# Patient Record
Sex: Male | Born: 1935 | Race: White | Hispanic: No | Marital: Married | Smoking: Never smoker
Health system: Southern US, Community
[De-identification: ages and names within clinical notes are randomized; demographics above are authoritative.]

## PROBLEM LIST (undated history)

## (undated) DIAGNOSIS — E039 Hypothyroidism, unspecified: Secondary | ICD-10-CM

## (undated) DIAGNOSIS — K5790 Diverticulosis of intestine, part unspecified, without perforation or abscess without bleeding: Secondary | ICD-10-CM

## (undated) DIAGNOSIS — I10 Essential (primary) hypertension: Secondary | ICD-10-CM

## (undated) DIAGNOSIS — B019 Varicella without complication: Secondary | ICD-10-CM

## (undated) DIAGNOSIS — K449 Diaphragmatic hernia without obstruction or gangrene: Secondary | ICD-10-CM

## (undated) DIAGNOSIS — K649 Unspecified hemorrhoids: Secondary | ICD-10-CM

## (undated) DIAGNOSIS — E0590 Thyrotoxicosis, unspecified without thyrotoxic crisis or storm: Secondary | ICD-10-CM

## (undated) DIAGNOSIS — E049 Nontoxic goiter, unspecified: Secondary | ICD-10-CM

## (undated) DIAGNOSIS — D126 Benign neoplasm of colon, unspecified: Secondary | ICD-10-CM

## (undated) DIAGNOSIS — N529 Male erectile dysfunction, unspecified: Secondary | ICD-10-CM

## (undated) DIAGNOSIS — I440 Atrioventricular block, first degree: Secondary | ICD-10-CM

## (undated) DIAGNOSIS — I4892 Unspecified atrial flutter: Secondary | ICD-10-CM

## (undated) DIAGNOSIS — G4730 Sleep apnea, unspecified: Secondary | ICD-10-CM

## (undated) DIAGNOSIS — K8020 Calculus of gallbladder without cholecystitis without obstruction: Secondary | ICD-10-CM

## (undated) DIAGNOSIS — K219 Gastro-esophageal reflux disease without esophagitis: Secondary | ICD-10-CM

## (undated) DIAGNOSIS — R251 Tremor, unspecified: Secondary | ICD-10-CM

## (undated) DIAGNOSIS — Z7901 Long term (current) use of anticoagulants: Secondary | ICD-10-CM

## (undated) DIAGNOSIS — M1990 Unspecified osteoarthritis, unspecified site: Secondary | ICD-10-CM

## (undated) DIAGNOSIS — K635 Polyp of colon: Secondary | ICD-10-CM

## (undated) DIAGNOSIS — T7840XA Allergy, unspecified, initial encounter: Secondary | ICD-10-CM

## (undated) DIAGNOSIS — N400 Enlarged prostate without lower urinary tract symptoms: Secondary | ICD-10-CM

## (undated) HISTORY — DX: Varicella without complication: B01.9

## (undated) HISTORY — DX: Essential (primary) hypertension: I10

## (undated) HISTORY — DX: Hypothyroidism, unspecified: E03.9

## (undated) HISTORY — PX: NASAL SEPTUM SURGERY: SHX37

## (undated) HISTORY — DX: Gilbert syndrome: E80.4

## (undated) HISTORY — DX: Calculus of gallbladder without cholecystitis without obstruction: K80.20

## (undated) HISTORY — PX: OTHER SURGICAL HISTORY: SHX169

## (undated) HISTORY — DX: Unspecified osteoarthritis, unspecified site: M19.90

## (undated) HISTORY — DX: Diaphragmatic hernia without obstruction or gangrene: K44.9

## (undated) HISTORY — PX: APPENDECTOMY: SHX54

## (undated) HISTORY — DX: Sleep apnea, unspecified: G47.30

## (undated) HISTORY — DX: Long term (current) use of anticoagulants: Z79.01

## (undated) HISTORY — DX: Unspecified atrial flutter: I48.92

## (undated) HISTORY — DX: Benign neoplasm of colon, unspecified: D12.6

## (undated) HISTORY — DX: Nontoxic goiter, unspecified: E04.9

## (undated) HISTORY — DX: Unspecified hemorrhoids: K64.9

## (undated) HISTORY — PX: TONSILLECTOMY: SHX5217

## (undated) HISTORY — DX: Thyrotoxicosis, unspecified without thyrotoxic crisis or storm: E05.90

## (undated) HISTORY — PX: POLYPECTOMY: SHX149

## (undated) HISTORY — DX: Gastro-esophageal reflux disease without esophagitis: K21.9

## (undated) HISTORY — DX: Allergy, unspecified, initial encounter: T78.40XA

## (undated) HISTORY — DX: Tremor, unspecified: R25.1

## (undated) HISTORY — DX: Polyp of colon: K63.5

## (undated) HISTORY — PX: COLONOSCOPY: SHX174

## (undated) HISTORY — DX: Atrioventricular block, first degree: I44.0

## (undated) HISTORY — DX: Diverticulosis of intestine, part unspecified, without perforation or abscess without bleeding: K57.90

## (undated) HISTORY — DX: Male erectile dysfunction, unspecified: N52.9

## (undated) HISTORY — DX: Benign prostatic hyperplasia without lower urinary tract symptoms: N40.0

## (undated) HISTORY — PX: PALATE / UVULA BIOPSY / EXCISION: SUR128

---

## 1999-11-05 ENCOUNTER — Encounter: Payer: Self-pay | Admitting: Dentistry

## 1999-11-05 ENCOUNTER — Ambulatory Visit (HOSPITAL_COMMUNITY): Admission: RE | Admit: 1999-11-05 | Discharge: 1999-11-05 | Payer: Self-pay | Admitting: Dentistry

## 2000-05-24 ENCOUNTER — Encounter (INDEPENDENT_AMBULATORY_CARE_PROVIDER_SITE_OTHER): Payer: Self-pay | Admitting: Specialist

## 2000-05-24 ENCOUNTER — Other Ambulatory Visit: Admission: RE | Admit: 2000-05-24 | Discharge: 2000-05-24 | Payer: Self-pay | Admitting: Urology

## 2001-06-14 ENCOUNTER — Ambulatory Visit (HOSPITAL_COMMUNITY): Admission: RE | Admit: 2001-06-14 | Discharge: 2001-06-14 | Payer: Self-pay | Admitting: Neurology

## 2001-06-14 ENCOUNTER — Encounter: Payer: Self-pay | Admitting: Neurology

## 2003-03-11 ENCOUNTER — Encounter: Payer: Self-pay | Admitting: Internal Medicine

## 2003-03-11 ENCOUNTER — Ambulatory Visit (HOSPITAL_COMMUNITY): Admission: RE | Admit: 2003-03-11 | Discharge: 2003-03-11 | Payer: Self-pay | Admitting: Internal Medicine

## 2005-04-12 ENCOUNTER — Encounter: Admission: RE | Admit: 2005-04-12 | Discharge: 2005-04-12 | Payer: Self-pay | Admitting: Internal Medicine

## 2005-05-06 ENCOUNTER — Encounter: Admission: RE | Admit: 2005-05-06 | Discharge: 2005-05-06 | Payer: Self-pay | Admitting: Endocrinology

## 2005-07-18 ENCOUNTER — Encounter (HOSPITAL_COMMUNITY): Admission: RE | Admit: 2005-07-18 | Discharge: 2005-07-19 | Payer: Self-pay | Admitting: Endocrinology

## 2006-12-05 ENCOUNTER — Ambulatory Visit: Payer: Self-pay | Admitting: Internal Medicine

## 2006-12-20 ENCOUNTER — Ambulatory Visit: Payer: Self-pay | Admitting: Internal Medicine

## 2006-12-20 ENCOUNTER — Encounter: Payer: Self-pay | Admitting: Internal Medicine

## 2007-03-15 ENCOUNTER — Ambulatory Visit (HOSPITAL_COMMUNITY): Admission: RE | Admit: 2007-03-15 | Discharge: 2007-03-15 | Payer: Self-pay | Admitting: *Deleted

## 2007-03-15 HISTORY — PX: CARDIOVERSION: SHX1299

## 2008-04-03 ENCOUNTER — Encounter: Admission: RE | Admit: 2008-04-03 | Discharge: 2008-04-03 | Payer: Self-pay | Admitting: Internal Medicine

## 2009-06-18 ENCOUNTER — Encounter: Admission: RE | Admit: 2009-06-18 | Discharge: 2009-06-18 | Payer: Self-pay | Admitting: Internal Medicine

## 2009-06-18 ENCOUNTER — Inpatient Hospital Stay (HOSPITAL_COMMUNITY): Admission: AD | Admit: 2009-06-18 | Discharge: 2009-06-21 | Payer: Self-pay | Admitting: Orthopedic Surgery

## 2010-08-22 LAB — COMPREHENSIVE METABOLIC PANEL
AST: 24 U/L (ref 0–37)
Albumin: 3.7 g/dL (ref 3.5–5.2)
CO2: 28 mEq/L (ref 19–32)
Calcium: 8.6 mg/dL (ref 8.4–10.5)
Creatinine, Ser: 0.76 mg/dL (ref 0.4–1.5)
Glucose, Bld: 107 mg/dL — ABNORMAL HIGH (ref 70–99)
Sodium: 137 mEq/L (ref 135–145)
Total Bilirubin: 1.9 mg/dL — ABNORMAL HIGH (ref 0.3–1.2)
Total Protein: 6.1 g/dL (ref 6.0–8.3)

## 2010-08-22 LAB — PROTIME-INR
INR: 2.91 — ABNORMAL HIGH (ref 0.00–1.49)
INR: 2.99 — ABNORMAL HIGH (ref 0.00–1.49)
INR: 3.07 — ABNORMAL HIGH (ref 0.00–1.49)
INR: 3.12 — ABNORMAL HIGH (ref 0.00–1.49)
Prothrombin Time: 30.8 seconds — ABNORMAL HIGH (ref 11.6–15.2)
Prothrombin Time: 31.5 seconds — ABNORMAL HIGH (ref 11.6–15.2)
Prothrombin Time: 31.9 seconds — ABNORMAL HIGH (ref 11.6–15.2)

## 2010-08-22 LAB — URINALYSIS, MICROSCOPIC ONLY
Bilirubin Urine: NEGATIVE
Glucose, UA: NEGATIVE mg/dL
Nitrite: NEGATIVE
Protein, ur: NEGATIVE mg/dL
pH: 6.5 (ref 5.0–8.0)

## 2010-08-22 LAB — CBC
Hemoglobin: 14.7 g/dL (ref 13.0–17.0)
MCHC: 34.4 g/dL (ref 30.0–36.0)
MCV: 93.6 fL (ref 78.0–100.0)
RDW: 12.8 % (ref 11.5–15.5)

## 2010-08-22 LAB — DIFFERENTIAL
Basophils Absolute: 0 10*3/uL (ref 0.0–0.1)
Lymphocytes Relative: 10 % — ABNORMAL LOW (ref 12–46)
Lymphs Abs: 1 10*3/uL (ref 0.7–4.0)
Monocytes Absolute: 0.6 10*3/uL (ref 0.1–1.0)

## 2010-10-19 NOTE — H&P (Signed)
NAME:  Kevin Potter, Kevin Potter NO.:  [id number]   MEDICAL RECORD NO.:  [id number]           PATIENT TYPE:   LOCATION:                                 FACILITY:   PHYSICIAN:  Elmore Guise., M.D.DATE OF BIRTH:  Apr 26, 1932   DATE OF ADMISSION:  03/15/2007  DATE OF DISCHARGE:                              HISTORY & PHYSICAL   INDICATION:  Atrial flutter, patient for elective cardioversion.   HISTORY OF PRESENT ILLNESS:  Kevin Potter is a very pleasant 77 year old  white male with past medical history of hypertension, benign tremor, who  was recently diagnosed with atrial flutter.  He was seen as a new  patient evaluation back in September. At that time he denied any  exertional chest pain or shortness of breath.  No orthopnea, no PND.  He  was started on Coumadin because his heart was out of rhythm.  Since  his last visit, he reports that, I just don't feel right.  He would  like to go ahead and pursue elective cardioversion.  He he has had his  Coumadin level checked, and he has been therapeutic his last three  checks.  He does report occasional episodes of dyspnea as well as  decreased exertional tolerance, and he would really like to come off  Coumadin if possible.  He has had no recent fever or cough.  No  orthopnea or PND.   REVIEW OF SYSTEMS:  Otherwise negative.   CURRENT MEDICATIONS:  1. Propranolol 20 mg three times daily.  2. Trihexyphenidyl  2 mg three times daily.  3. Coumadin 2.5 mg 7 days a week.  4. Lorazepam 1 mg daily.  5. Fluticasone spray once daily.  6. Levitra or Viagra p.r.n.   ALLERGIES:  None.   FAMILY HISTORY:  Positive for heart disease in his mother and brothers.   SOCIAL HISTORY:  He is married.  He works as a Forensic scientist.  He  tries to walk 30 minutes per day.  No tobacco.  He drinks one glass of  red wine daily.  No significant caffeine intake.   PAST SURGICAL HISTORY:  1. Appendectomy.  2. Deviated septum repair.  3.  Tonsillectomy.  4. He has also had his uvula removed secondary to sleep apnea.   PHYSICAL EXAMINATION:  VITAL SIGNS:  His weight is 209 pounds.  His  blood pressure is 90/70.  His heart rate is 76 and regular.  GENERAL:  He is a very pleasant elderly white male, alert and oriented  x4, in no acute distress.  NECK:  He has no JVD.  LUNGS:  Clear.  HEART:  Regular with normal S1-S2.  ABDOMEN:  Soft, nontender, nondistended.  No rebound or guarding.  EXTREMITIES:  Warm with 2+ pulses and no significant edema.   His last stress evaluation was February 09, 2007, and showed no  inducible ischemia with mild LV dysfunction with an EF of 47%.   His last Coumadin level per his recall is 2.0 with one prior to that 2.4  and then 2.5.  He had a BUN  and creatinine of 11 and 0.8, respectively,  potassium of 4.2.  White blood cell count of 5.0 with a hemoglobin of  14.8 and platelet count of 221.   IMPRESSION:  1. Atrial flutter.  2. History of very mild left ventricular dysfunction with ejection      fraction of 45-50%.  3. Hypertension.   PLAN:  1. I discussed risks and benefits of elective cardioversion with him      at length.  He would like to proceed with the procedure.  This will      be scheduled for October 9.  Should he remain in sinus rhythm for 4      weeks, I did discuss at that time we can discontinue his Coumadin.  2. Further recommendations pending his cardioversion.      Elmore Guise., M.D.  Electronically Signed     TWK/MEDQ  D:  03/12/2007  T:  03/12/2007  Job:  [id number]   cc:   Loraine Leriche A. Perini, M.D.

## 2010-10-19 NOTE — Op Note (Signed)
NAME:  Kevin Potter, Kevin Potter NO.:  [id number]   MEDICAL RECORD NO.:  [id number]          PATIENT TYPE:  OIB   LOCATION:  2853                         FACILITY:  [facility]   PHYSICIAN:  Elmore Guise., M.D.DATE OF BIRTH:  1931-09-01   DATE OF PROCEDURE:  03/15/2007  DATE OF DISCHARGE:                               OPERATIVE REPORT   PROCEDURE:  Elective DC cardioversion.   HISTORY OF PRESENT ILLNESS:  [name] is a very pleasant, 77 year old,  white male past medical history of atrial flutter, hypertension, benign  tremor who presents for elective cardioversion.  The patient was  diagnosed with atrial flutter approximately 6 weeks ago.  He was placed  on Coumadin and he has been therapeutic with his INR for the last 4  weeks.  He is now referred for elective cardioversion.   DESCRIPTION OF PROCEDURE:  The patient underwent successful  cardioversion, anesthesia was present for sedation.  The patient had  cardioversion x1 with 50 joules pads in the anterior and posterior  position.  No apparent complications.  The patient tolerated the  procedure well.      Elmore Guise., M.D.  Electronically Signed     TWK/MEDQ  D:  03/15/2007  T:  03/15/2007  Job:  [id number]

## 2010-10-25 ENCOUNTER — Other Ambulatory Visit: Payer: Self-pay | Admitting: Cardiology

## 2010-10-26 ENCOUNTER — Telehealth: Payer: Self-pay | Admitting: Cardiology

## 2010-10-26 NOTE — Telephone Encounter (Signed)
RETURNING CALL. NOT SURE WHO CALLED OR WHY.

## 2010-10-26 NOTE — Telephone Encounter (Signed)
escribe medication per fax request ; LM for pt to call to schedule an app.

## 2010-10-26 NOTE — Telephone Encounter (Signed)
Had LM for him to call back to schedule an app since it has been a yr. Refilled his med. Gave him an app for June.

## 2010-11-06 ENCOUNTER — Other Ambulatory Visit: Payer: Self-pay | Admitting: Cardiology

## 2010-11-08 NOTE — Telephone Encounter (Signed)
escribe medication per fax request  

## 2010-11-17 ENCOUNTER — Encounter: Payer: Self-pay | Admitting: Cardiology

## 2010-11-22 ENCOUNTER — Ambulatory Visit (INDEPENDENT_AMBULATORY_CARE_PROVIDER_SITE_OTHER): Payer: Medicare Other | Admitting: Cardiology

## 2010-11-22 ENCOUNTER — Encounter: Payer: Self-pay | Admitting: Cardiology

## 2010-11-22 VITALS — BP 104/70 | HR 83 | Ht 74.0 in | Wt 209.0 lb

## 2010-11-22 DIAGNOSIS — I10 Essential (primary) hypertension: Secondary | ICD-10-CM

## 2010-11-22 DIAGNOSIS — Z7901 Long term (current) use of anticoagulants: Secondary | ICD-10-CM

## 2010-11-22 DIAGNOSIS — I4892 Unspecified atrial flutter: Secondary | ICD-10-CM

## 2010-11-22 NOTE — Patient Instructions (Signed)
Continue your same therapy.  I will see you again in 1 year.

## 2010-11-23 ENCOUNTER — Encounter: Payer: Self-pay | Admitting: Cardiology

## 2010-11-23 DIAGNOSIS — I10 Essential (primary) hypertension: Secondary | ICD-10-CM | POA: Insufficient documentation

## 2010-11-23 DIAGNOSIS — Z7901 Long term (current) use of anticoagulants: Secondary | ICD-10-CM | POA: Insufficient documentation

## 2010-11-23 DIAGNOSIS — I4892 Unspecified atrial flutter: Secondary | ICD-10-CM | POA: Insufficient documentation

## 2010-11-23 NOTE — Assessment & Plan Note (Signed)
Well controlled on propranolol. 

## 2010-11-23 NOTE — Assessment & Plan Note (Signed)
He has chronic atrial flutter. His rate was well controlled on propranolol. He is anticoagulated. Since he is asymptomatic I see no reason to pursue further therapies. If he were symptomatic we could consider ablation. He will need to continue anticoagulation long term.

## 2010-11-23 NOTE — Progress Notes (Signed)
   Hilbert Bible Date of Birth: April 19, 1932   History of Present Illness: Mr. Kevin Potter is seen for yearly followup. He has a history of chronic atrial flutter. He had cardioversion in 2008 but had recurrence. He was intolerant to Rythmol. He was subsequently treated with rate control and anticoagulation. He denies any cardiac symptoms. He denies dizziness, shortness of breath, chest pain, palpitations. He has had no edema. He walks every day.  Current Outpatient Prescriptions on File Prior to Visit  Medication Sig Dispense Refill  . finasteride (PROSCAR) 5 MG tablet Take 5 mg by mouth daily.        . fluticasone (VERAMYST) 27.5 MCG/SPRAY nasal spray Place 2 sprays into the nose as needed.       . loperamide (IMODIUM) 2 MG capsule Take 2 mg by mouth 2 (two) times daily.        Marland Kitchen LORazepam (ATIVAN) 1 MG tablet Take 1 mg by mouth daily.        Marland Kitchen PRADAXA 150 MG CAPS TAKE ONE CAPSULE BY MOUTH TWICE DAILY  60 capsule  5  . propranolol (INDERAL) 20 MG tablet Take 20 mg by mouth 3 (three) times daily.        . Tamsulosin HCl (FLOMAX) 0.4 MG CAPS Take 0.4 mg by mouth daily.        . trihexyphenidyl (ARTANE) 2 MG tablet Take 2 mg by mouth 3 (three) times daily with meals.          No Known Allergies  Past Medical History  Diagnosis Date  . Atrial flutter   . Chronic anticoagulation   . Tremor   . Hypertension     Past Surgical History  Procedure Date  . Cardioversion 03/15/2007    elective DC/x1 with joint pads in the anterior & posterior position/no complications  . Appendectomy   . Nasal septum surgery   . Tonsillectomy     with uvula removed  . Left ankle surgery     History  Smoking status  . Never Smoker   Smokeless tobacco  . Never Used    History  Alcohol Use No    Family History  Problem Relation Age of Onset  . Cancer Mother   . Cancer Sister     Review of Systems: The review of systems is positive for April muscle in his left flank. The symptoms have resolved.   All other systems were reviewed and are negative.  Physical Exam: BP 104/70  Pulse 83  Ht 6\' 2"  (1.88 m)  Wt 209 lb (94.802 kg)  BMI 26.83 kg/m2 He is a pleasant elderly white male in no acute distress. HEENT exam is unremarkable. He has no JVD or bruits. Lungs are clear. Cardiac exam reveals an irregular rate and rhythm without gallop, murmur, or click. Abdomen is soft and nontender. He has no edema. Pedal pulses are excellent. His neurologic exam is nonfocal. LABORATORY DATA: ECG today demonstrates atrial flutter with variable block. Rate is 83 beats per minute. He has a nonspecific interventricular conduction delay. He has occasional PVCs.  Assessment / Plan:

## 2011-03-17 LAB — BASIC METABOLIC PANEL
CO2: 27
Chloride: 109
Creatinine, Ser: 0.79
GFR calc Af Amer: >60
GFR calc non Af Amer: >60
Glucose, Bld: 95

## 2011-03-17 LAB — CBC: Hemoglobin: 14.9

## 2011-05-02 ENCOUNTER — Encounter: Payer: Self-pay | Admitting: Cardiology

## 2011-05-02 ENCOUNTER — Telehealth: Payer: Self-pay | Admitting: Cardiology

## 2011-05-02 NOTE — Telephone Encounter (Signed)
As per Chasity, Mr Kevin Potter is scheduled to have cold thermo therapy procedure in the office on Dec 3rd with Dr Annabell Howells at [facility] urology. The pt has been cleared to come off Pradaxa and Aspirin per Dr Waynard Edwards but Forde Dandy had also sent the request to [facility]. Dr Swaziland also aware pt is for procedure and cleared pt. Pt will take last dose of Pradaxa and Aspirin on Nov 30th and resume on Dec 4th as advised per Dr Annabell Howells.  Letter faxed to [facility].

## 2011-05-02 NOTE — Telephone Encounter (Signed)
New problem:  Per Chasity. Surgery is 12 /3 @ 1 p.m. Pt need to come office [facility] . Fax over request on 11/21.

## 2011-05-04 ENCOUNTER — Other Ambulatory Visit: Payer: Self-pay | Admitting: Cardiology

## 2011-05-09 ENCOUNTER — Telehealth: Payer: Self-pay | Admitting: Cardiology

## 2011-05-09 NOTE — Telephone Encounter (Signed)
Called stating he had the prostate procedure done today and wanted to know when he should restart Pradaxa. Per Dr. Swaziland if he did not have any bleeding then restart Pradaxa tomorrow. Kevin Potter says he did not have any bleeding so advised to start back on Pradaxa tomorrow.

## 2011-05-09 NOTE — Telephone Encounter (Signed)
New message:  Pt went off of Pradaxa  for procedure and had this procedure today.  Pt would like to know when he should start taking this again.  Please call and advise.

## 2011-08-17 ENCOUNTER — Ambulatory Visit (INDEPENDENT_AMBULATORY_CARE_PROVIDER_SITE_OTHER): Payer: Medicare Other | Admitting: Internal Medicine

## 2011-08-17 ENCOUNTER — Encounter: Payer: Self-pay | Admitting: Internal Medicine

## 2011-08-17 ENCOUNTER — Telehealth: Payer: Self-pay

## 2011-08-17 VITALS — BP 112/80 | HR 60 | Ht 74.0 in | Wt 211.6 lb

## 2011-08-17 DIAGNOSIS — Z86010 Personal history of colonic polyps: Secondary | ICD-10-CM

## 2011-08-17 DIAGNOSIS — K5730 Diverticulosis of large intestine without perforation or abscess without bleeding: Secondary | ICD-10-CM

## 2011-08-17 DIAGNOSIS — R195 Other fecal abnormalities: Secondary | ICD-10-CM

## 2011-08-17 MED ORDER — PEG-KCL-NACL-NASULF-NA ASC-C 100 G PO SOLR: 1.0000 | Freq: Once | ORAL | Status: DC

## 2011-08-17 NOTE — Telephone Encounter (Signed)
08/17/2011    RE: Kevin Potter DOB: 10/31/1931 MRN: [id number]   Dear Dr. Swaziland,    We have scheduled the above patient for an endoscopic procedure. Our records show that he is on anticoagulation therapy.   Please advise as to how long the patient may come off his therapy of Pradaxa prior to the procedure, which is scheduled for 10-04-11.  Please fax back/ or route the completed form to [facility] at [phone].   Sincerely,  Gracy Racer

## 2011-08-17 NOTE — Patient Instructions (Addendum)
You have been scheduled for an endoscopy and colonoscopy with propofol. Please follow the written instructions given to you at your visit today. Please pick up your prep at the pharmacy within the next 1-3 days.  We will contact you regarding your Pradaxa after we have heard from your cardiologist

## 2011-08-17 NOTE — Progress Notes (Signed)
HISTORY OF PRESENT ILLNESS:  Kevin Potter is a 78 y.o. male with MULTIPLE significant medical problems as listed below. These include, but are not limited to, hypertension, thyroid disease, sleep apnea, and atrial arrhythmia for which she is on chronic anticoagulation in the form of Pradaxa. He has been followed in this office for history of adenomatous colon polyps. Index colonoscopy was performed 2005. Followup colonoscopy performed in 2008. Also known to have marked diverticulosis. He is sent today regarding Hemoccult-positive stool, identified on routine annual physical examination. Patient denies any GI symptoms. Specifically, he denies melena or hematochezia. Review of outside records findings positive hemosure test 07/15/2011. Review of laboratories dated 07/12/2011 reveals no significant abnormalities. His hemoglobin was normal at 15.4 with normal MCV. Elevated bilirubin of 1.7 consistent with Gilbert's. The patient has not had upper endoscopy. He is not on PPI prophylactically. He denies NSAID use  REVIEW OF SYSTEMS:  All non-GI ROS negative except for tremor, irregular heart rate, joint aches  Past Medical History  Diagnosis Date  . Atrial flutter   . Chronic anticoagulation   . Tremor   . Hypertension   . Sleep apnea   . Hypothyroidism   . Goiter   . Erectile dysfunction   . Hyperthyroidism   . BPH (benign prostatic hyperplasia)   . 1st degree AV block   . Gallstones   . Colonic adenoma   . Chicken pox   . Gilbert's syndrome     Past Surgical History  Procedure Date  . Cardioversion 03/15/2007    elective DC/x1 with joint pads in the anterior & posterior position/no complications  . Appendectomy   . Nasal septum surgery   . Tonsillectomy     with uvula removed  . Left ankle surgery     Social History Kevin Potter  reports that he has never smoked. He has never used smokeless tobacco. He reports that he drinks alcohol. His drug history not on file.  family history  includes Cancer in his mother and sister and Heart disease in his brother, father, and mother.  No Known Allergies     PHYSICAL EXAMINATION: Vital signs: BP 112/80  Pulse 60  Ht 6\' 2"  (1.88 m)  Wt 211 lb 9.6 oz (95.981 kg)  BMI 27.17 kg/m2  Constitutional: generally well-appearing, no acute distress Psychiatric: alert and oriented x3, cooperative Eyes: extraocular movements intact, anicteric, conjunctiva pink Mouth: oral pharynx moist, no lesions Neck: supple no lymphadenopathy Cardiovascular: heart regular rate and rhythm, no murmur Lungs: clear to auscultation bilaterally Abdomen: soft, nontender, nondistended, no obvious ascites, no peritoneal signs, normal bowel sounds, no organomegaly Rectal:deferred until colonoscopy Extremities: no lower extremity edema bilaterally Skin: no lesions on visible extremities Neuro: No focal deficits. No asterixis. Resting tremor of the head    ASSESSMENT:  #1. Hemoccult-positive stool in the absence of GI symptoms with a normal hemoglobin in a patient on Pradaxa #2. History of adenomatous colon polyps. Last colonoscopy 2008. Due for followup this year #3. Marked diverticulosis #4. Multiple significant medical problems as outlined above  PLAN:  #1. Colonoscopy and upper endoscopy to evaluate Hemoccult-positive stool and provide colorectal neoplasia surveillance. The patient is high-risk given his comorbidities and the need to address Pradaxa therapy.The nature of the procedure, as well as the risks, benefits, and alternatives were carefully and thoroughly reviewed with the patient. Ample time for discussion and questions allowed. The patient understood, was satisfied, and agreed to proceed. Movi prep prescribed. Patient was instructed on its use. #2.  Consult with the patient's cardiologist, Dr. Swaziland, regarding the feasibility of holding Pradaxa therapy prior to his procedures, with the plans to resume therapy post procedure. Patient tells me  that he has held anticoagulation therapy in the past for his prostate related procedures.

## 2011-08-18 NOTE — Telephone Encounter (Signed)
He may stop Pradaxa 48 hours before procedure. Ysidro Ramsay Swaziland MD, [facility]

## 2011-08-19 ENCOUNTER — Telehealth: Payer: Self-pay

## 2011-08-19 NOTE — Telephone Encounter (Signed)
Left message for patient to call me back. 

## 2011-08-24 ENCOUNTER — Telehealth: Payer: Self-pay

## 2011-08-24 NOTE — Telephone Encounter (Signed)
Told patient Dr. Swaziland had said he could be off his Pradaxa for 48 hours prior to procedure; patient acknowledged

## 2011-10-04 ENCOUNTER — Ambulatory Visit (AMBULATORY_SURGERY_CENTER): Payer: Medicare Other | Admitting: Internal Medicine

## 2011-10-04 ENCOUNTER — Encounter: Payer: Self-pay | Admitting: Internal Medicine

## 2011-10-04 VITALS — BP 135/98 | HR 83 | Temp 97.6°F | Resp 16 | Ht 74.0 in | Wt 211.0 lb

## 2011-10-04 DIAGNOSIS — D126 Benign neoplasm of colon, unspecified: Secondary | ICD-10-CM

## 2011-10-04 DIAGNOSIS — Z1211 Encounter for screening for malignant neoplasm of colon: Secondary | ICD-10-CM

## 2011-10-04 DIAGNOSIS — R195 Other fecal abnormalities: Secondary | ICD-10-CM

## 2011-10-04 DIAGNOSIS — Z86010 Personal history of colonic polyps: Secondary | ICD-10-CM

## 2011-10-04 DIAGNOSIS — K222 Esophageal obstruction: Secondary | ICD-10-CM

## 2011-10-04 DIAGNOSIS — K5730 Diverticulosis of large intestine without perforation or abscess without bleeding: Secondary | ICD-10-CM

## 2011-10-04 MED ORDER — SODIUM CHLORIDE 0.9 % IV SOLN: 500.0000 mL | INTRAVENOUS | Status: DC

## 2011-10-04 NOTE — Patient Instructions (Signed)
YOU HAD AN ENDOSCOPIC PROCEDURE TODAY AT THE [facility]: Refer to the procedure report that was given to you for any specific questions about what was found during the examination.  If the procedure report does not answer your questions, please call your gastroenterologist to clarify.  If you requested that your care partner not be given the details of your procedure findings, then the procedure report has been included in a sealed envelope for you to review at your convenience later.  YOU SHOULD EXPECT: Some feelings of bloating in the abdomen. Passage of more gas than usual.  Walking can help get rid of the air that was put into your GI tract during the procedure and reduce the bloating. If you had a lower endoscopy (such as a colonoscopy or flexible sigmoidoscopy) you may notice spotting of blood in your stool or on the toilet paper. If you underwent a bowel prep for your procedure, then you may not have a normal bowel movement for a few days.  DIET: Your first meal following the procedure should be a light meal and then it is ok to progress to your normal diet.  A half-sandwich or bowl of soup is an example of a good first meal.  Heavy or fried foods are harder to digest and may make you feel nauseous or bloated.  Likewise meals heavy in dairy and vegetables can cause extra gas to form and this can also increase the bloating.  Drink plenty of fluids but you should avoid alcoholic beverages for 24 hours.  ACTIVITY: Your care partner should take you home directly after the procedure.  You should plan to take it easy, moving slowly for the rest of the day.  You can resume normal activity the day after the procedure however you should NOT DRIVE or use heavy machinery for 24 hours (because of the sedation medicines used during the test).    SYMPTOMS TO REPORT IMMEDIATELY: A gastroenterologist can be reached at any hour.  During normal business hours, 8:30 AM to 5:00 PM Monday through Friday,  call [phone].  After hours and on weekends, please call the GI answering service at [phone] who will take a message and have the physician on call contact you.   Following lower endoscopy (colonoscopy or flexible sigmoidoscopy):  Excessive amounts of blood in the stool  Significant tenderness or worsening of abdominal pains  Swelling of the abdomen that is new, acute  Fever of 100F or higher  Following upper endoscopy (EGD)  Vomiting of blood or coffee ground material  New chest pain or pain under the shoulder blades  Painful or persistently difficult swallowing  New shortness of breath  Fever of 100F or higher  Black, tarry-looking stools  FOLLOW UP: If any biopsies were taken you will be contacted by phone or by letter within the next 1-3 weeks.  Call your gastroenterologist if you have not heard about the biopsies in 3 weeks.  Our staff will call the home number listed on your records the next business day following your procedure to check on you and address any questions or concerns that you may have at that time regarding the information given to you following your procedure. This is a courtesy call and so if there is no answer at the home number and we have not heard from you through the emergency physician on call, we will assume that you have returned to your regular daily activities without incident.  SIGNATURES/CONFIDENTIALITY: You and/or your care   partner have signed paperwork which will be entered into your electronic medical record.  These signatures attest to the fact that that the information above on your After Visit Summary has been reviewed and is understood.  Full responsibility of the confidentiality of this discharge information lies with you and/or your care-partner.  

## 2011-10-04 NOTE — Progress Notes (Signed)
Patient did not have preoperative order for IV antibiotic SSI prophylaxis. (G8918)  Patient did not experience any of the following events: a burn prior to discharge; a fall within the facility; wrong site/side/patient/procedure/implant event; or a hospital transfer or hospital admission upon discharge from the facility. (G8907)  

## 2011-10-04 NOTE — Op Note (Signed)
[facility] [address] [place], [place]  [address]  COLONOSCOPY PROCEDURE REPORT  PATIENT:  Kevin Potter, Kevin Potter  MR#:  [id number] BIRTHDATE:  22-Feb-1932, 80 yrs. old  GENDER:  male ENDOSCOPIST:  Wilhemina Bonito. Eda Keys, MD REF. BY:  Surveillance Program Recall, PROCEDURE DATE:  10/04/2011 PROCEDURE:  Colonoscopy with snare polypectomy x 7 ASA CLASS:  Class III INDICATIONS:  history of pre-cancerous (adenomatous) colon polyps, surveillance and high-risk screening, heme positive stool ; index 2005, f/u 2008 MEDICATIONS:   MAC sedation, administered by CRNA, propofol (Diprivan) 250 mg IV  DESCRIPTION OF PROCEDURE:   After the risks benefits and alternatives of the procedure were thoroughly explained, informed consent was obtained.  Digital rectal exam was performed and revealed no abnormalities.   The LB [id number] endoscope was introduced through the anus and advanced to the cecum, which was identified by both the appendix and ileocecal valve, without limitations.  The quality of the prep was excellent, using MoviPrep.  The instrument was then slowly withdrawn as the colon was fully examined. <<PROCEDUREIMAGES>>  FINDINGS:  There were seven polyps identified and removed -1mm in the cecum, five 1-65mm in ascending, and 3mm in transverse colon. Polyps were snared without cautery. Retrieval was successful. Severe diverticulosis was found in the left colon.  Otherwise normal colonoscopy without other polyps, masses, vascular ectasias, or inflammatory changes.   Retroflexed views in the rectum revealed internal hemorrhoids.  The time to cecum = 2:10 minutes. The scope was then withdrawn in 22:12  minutes from the cecum and the procedure completed.  COMPLICATIONS:  None  ENDOSCOPIC IMPRESSION: 1) Polyps, multiple small - removed 2) Severe diverticulosis in the left colon 3) Otherwise normal colonoscopy 4) Internal hemorrhoids  RECOMMENDATIONS: 1) No routine follow up  surviellance exam planned 2) Upper endoscopy today ______________________________ Wilhemina Bonito. Eda Keys, MD  CC:  Rodrigo Ran, MD; The Patient  n. eSIGNED:   Wilhemina Bonito. Eda Keys at 10/04/2011 02:31 PM  Dicie Beam, [phone]

## 2011-10-04 NOTE — Op Note (Signed)
[facility] [address] [place], [place]  [address]  ENDOSCOPY PROCEDURE REPORT  PATIENT:  [name], Kevin Potter  MR#:  [id number] BIRTHDATE:  07/25/1931, 80 yrs. old  GENDER:  male  ENDOSCOPIST:  Wilhemina Bonito. Eda Keys, MD Referred by:  Office  PROCEDURE DATE:  10/04/2011 PROCEDURE:  EGD, diagnostic 19147 ASA CLASS:  Class III INDICATIONS:  hemoccult positive stool  MEDICATIONS:   MAC sedation, administered by CRNA, propofol (Diprivan) 100 mg IV TOPICAL ANESTHETIC:  none  DESCRIPTION OF PROCEDURE:   After the risks benefits and alternatives of the procedure were thoroughly explained, informed consent was obtained.  The [facility] [id number]-H180 [id number] endoscope was introduced through the mouth and advanced to the second portion of the duodenum, without limitations.  The instrument was slowly withdrawn as the mucosa was fully examined. <<PROCEDUREIMAGES>>  A 15mm ring-like stricture was found in the distal esophagus. Otherwise the examination was normal to D2.    Retroflexed views revealed no abnormalities.    The scope was then withdrawn from the patient and the procedure completed.  COMPLICATIONS:  None  ENDOSCOPIC IMPRESSION: 1) Stricture in the distal esophagus (not dilated - denied dysphagia) 2) Otherwise normal examination  RECOMMENDATIONS: 1) Resume Pradaxa today 2) GI follow up as needed  ______________________________ Wilhemina Bonito. Eda Keys, MD  CC:  Rodrigo Ran, MDThe Patient  n. Rosalie DoctorWilhemina Bonito. Eda Keys at 10/04/2011 02:39 PM  Dicie Beam, [phone]

## 2011-10-05 ENCOUNTER — Telehealth: Payer: Self-pay | Admitting: *Deleted

## 2011-10-05 NOTE — Telephone Encounter (Signed)
  Follow up Call-  Call back number 10/04/2011  Post procedure Call Back phone  # [phone]  Permission to leave phone message Yes     Patient questions:  Do you have a fever, pain , or abdominal swelling? no Pain Score  0 *  Have you tolerated food without any problems? yes  Have you been able to return to your normal activities? yes  Do you have any questions about your discharge instructions: Diet   no Medications  no Follow up visit  no  Do you have questions or concerns about your Care? no  Actions: * If pain score is 4 or above: No action needed, pain <4.

## 2011-10-11 ENCOUNTER — Encounter: Payer: Self-pay | Admitting: Internal Medicine

## 2011-10-31 ENCOUNTER — Other Ambulatory Visit: Payer: Self-pay | Admitting: Cardiology

## 2011-12-23 ENCOUNTER — Other Ambulatory Visit: Payer: Self-pay | Admitting: Cardiology

## 2012-01-20 ENCOUNTER — Other Ambulatory Visit: Payer: Self-pay | Admitting: Cardiology

## 2012-02-14 ENCOUNTER — Ambulatory Visit
Admission: RE | Admit: 2012-02-14 | Discharge: 2012-02-14 | Disposition: A | Discharge status: Home / Self Care | Payer: Medicare Other | Source: Ambulatory Visit | Attending: Internal Medicine | Admitting: Internal Medicine

## 2012-02-14 ENCOUNTER — Other Ambulatory Visit: Payer: Self-pay | Admitting: Internal Medicine

## 2012-02-14 DIAGNOSIS — T17308A Unspecified foreign body in larynx causing other injury, initial encounter: Secondary | ICD-10-CM

## 2012-02-29 ENCOUNTER — Ambulatory Visit (INDEPENDENT_AMBULATORY_CARE_PROVIDER_SITE_OTHER): Payer: Medicare Other | Admitting: Internal Medicine

## 2012-02-29 ENCOUNTER — Encounter: Payer: Self-pay | Admitting: Internal Medicine

## 2012-02-29 VITALS — BP 108/66 | HR 60 | Ht 72.0 in | Wt 207.1 lb

## 2012-02-29 DIAGNOSIS — R933 Abnormal findings on diagnostic imaging of other parts of digestive tract: Secondary | ICD-10-CM

## 2012-02-29 DIAGNOSIS — Z860100 Personal history of colon polyps, unspecified: Secondary | ICD-10-CM

## 2012-02-29 DIAGNOSIS — K219 Gastro-esophageal reflux disease without esophagitis: Secondary | ICD-10-CM

## 2012-02-29 DIAGNOSIS — K222 Esophageal obstruction: Secondary | ICD-10-CM

## 2012-02-29 MED ORDER — OMEPRAZOLE 40 MG PO CPDR: 40.0000 mg | DELAYED_RELEASE_CAPSULE | Freq: Every day | ORAL | Status: DC

## 2012-02-29 NOTE — Patient Instructions (Addendum)
We have sent the following medications to your pharmacy for you to pick up at your convenience:  Omeprazole  We have given you some reflux precautions to use as a guide.  Please follow up with Dr. Marina Goodell as needed

## 2012-02-29 NOTE — Progress Notes (Signed)
HISTORY OF PRESENT ILLNESS:  Kevin Potter is a 78 y.o. male with MULTIPLE significant medical problems as listed below. These include, but are not limited to, hypertension, thyroid disease, sleep apnea, and atrial arrhythmia for which she is on chronic anticoagulation in the form of Pradaxa. He has been followed in this office for history of adenomatous colon polyps. Index colonoscopy was performed 2005. Followup colonoscopy performed in 2008. Also known to have marked diverticulosis. He was seen in the office 08/17/2011 regarding Hemoccult-positive stool, identified on routine annual physical examination. See that dictation for details. He subsequently underwent colonoscopy and upper endoscopy 10/04/2011. Colonoscopy revealed diminutive polyps (adenoma) which were removed as well as severe left-sided diverticulosis and internal hemorrhoids. Upper endoscopy revealed an incidental distal esophageal stricture. Otherwise normal. The patient is sent today regarding a new problem. About one month ago, after COMPLETING his lunch, he developed the abrupt onset of difficulty breathing. He felt like he could not get air in or out. He is perfectly clear that he did not have a food impaction of any sort. His wife performed a "Heimlich" maneuver. No food or mucus produced with the maneuver. Breathing issues resolved. He was well until about one week later when he was AWOKEN with a similar episode. This was many hours after eating. His wife, again, performed a "Heimlich maneuver". Breathing improved. Last night, while sucking on a mint, he felt like he was going to have another episode, but did not. The patient denies any new medications. He does have a history of sleep apnea as well as pharyngeal surgery. Does have occasional heartburn and indigestion. No other GI complaints. Review of radiology from 02/14/2012 shows a single contrast barium esophagram with tablet. Small hiatal hernia noted. Penetration without definite  aspiration of barium noted. No evidence for obstructing lesion of the esophagus. The stricture was not seen. Barium tablet passed easily   REVIEW OF SYSTEMS:  All non-GI ROS negative except for irregular heart rate and back pain  Past Medical History  Diagnosis Date  . Atrial flutter   . Chronic anticoagulation   . Tremor   . Hypertension   . Sleep apnea   . Hypothyroidism   . Goiter   . Erectile dysfunction   . Hyperthyroidism   . BPH (benign prostatic hyperplasia)   . 1st degree AV block   . Gallstones   . Colonic adenoma   . Chicken pox   . Gilbert's syndrome   . Diverticulosis   . Colon polyps   . Hemorrhoids   . Hiatal hernia     Past Surgical History  Procedure Date  . Cardioversion 03/15/2007    elective DC/x1 with joint pads in the anterior & posterior position/no complications  . Appendectomy   . Nasal septum surgery   . Tonsillectomy     with uvula removed  . Left ankle surgery   . Uva   . Palate / uvula biopsy / excision     Social History Kevin Potter  reports that he has never smoked. He has never used smokeless tobacco. He reports that he drinks alcohol. His drug history not on file.  family history includes Cancer in his mother and sister and Heart disease in his brother, father, and mother.  No Known Allergies     PHYSICAL EXAMINATION: Vital signs: BP 108/66  Pulse 60  Ht 6' (1.829 m)  Wt 207 lb 2 oz (93.951 kg)  BMI 28.09 kg/m2 General: Well-developed, well-nourished, no acute distress HEENT: Sclerae  are anicteric, conjunctiva pink. Oral mucosa intact Lungs: Clear Heart: irregular Abdomen: soft, nontender, nondistended, no obvious ascites, no peritoneal signs, normal bowel sounds. No organomegaly. Extremities: No edema Psychiatric: alert and oriented x3. Cooperative    ASSESSMENT:  #1. Episodic spells with breathing difficulty as described. Sounds like laryngospasm. Barium esophagram demonstrating penetration without obvious  aspiration. These are not symptoms related to the incidental stricture noted on endoscopy. May be due to reflux, though not certain. No indication for endoscopy with esophageal dilation. #2. GERD by history #3. Incidental esophageal stricture on recent endoscopy #4. History of adenomatous colon polyps. No routine surveillance plan due to age and comorbidities   PLAN:  #1. Strict reflux precautions #2. Prescribe omeprazole 40 mg daily #3. If problems recur despite these measures, then I would recommend a modified barium swallow with speech pathology to formally rule out aspiration. As well, could consider pulmonary evaluation for possible laryngospasm of other etiology. I will leave these possible valuations to the discretion of Dr. Waynard Potter. I discussed this with the patient in detail.

## 2012-05-04 ENCOUNTER — Other Ambulatory Visit: Payer: Self-pay | Admitting: *Deleted

## 2012-05-04 MED ORDER — DABIGATRAN ETEXILATE MESYLATE 150 MG PO CAPS: 150.0000 mg | ORAL_CAPSULE | Freq: Two times a day (BID) | ORAL | Status: DC

## 2012-06-01 ENCOUNTER — Telehealth: Payer: Self-pay | Admitting: Cardiology

## 2012-06-01 NOTE — Telephone Encounter (Signed)
**Note De-Identified  Obfuscation** Pt. advised that samples are ready for him to pick up at the front office, he verbalized understanding.

## 2012-06-01 NOTE — Telephone Encounter (Signed)
New problem:    Samples of pradaxa  150 mg.

## 2012-06-12 ENCOUNTER — Ambulatory Visit (INDEPENDENT_AMBULATORY_CARE_PROVIDER_SITE_OTHER): Payer: Medicare Other | Admitting: Nurse Practitioner

## 2012-06-12 ENCOUNTER — Encounter: Payer: Self-pay | Admitting: Nurse Practitioner

## 2012-06-12 VITALS — BP 126/84 | HR 66 | Ht 74.0 in | Wt 211.0 lb

## 2012-06-12 DIAGNOSIS — I4892 Unspecified atrial flutter: Secondary | ICD-10-CM

## 2012-06-12 DIAGNOSIS — Z7901 Long term (current) use of anticoagulants: Secondary | ICD-10-CM

## 2012-06-12 LAB — CBC WITH DIFFERENTIAL/PLATELET
Basophils Absolute: 0.1 10*3/uL (ref 0.0–0.1)
Basophils Relative: 1.2 % (ref 0.0–3.0)
Eosinophils Absolute: 0.1 10*3/uL (ref 0.0–0.7)
Eosinophils Relative: 0.9 % (ref 0.0–5.0)
HCT: 44.1 % (ref 39.0–52.0)
Hemoglobin: 15.1 g/dL (ref 13.0–17.0)
Lymphocytes Relative: 19.7 % (ref 12.0–46.0)
Lymphs Abs: 1.2 10*3/uL (ref 0.7–4.0)
MCHC: 34.2 g/dL (ref 30.0–36.0)
MCV: 92.5 fl (ref 78.0–100.0)
Monocytes Absolute: 0.6 10*3/uL (ref 0.1–1.0)
Monocytes Relative: 9.3 % (ref 3.0–12.0)
Neutro Abs: 4.3 10*3/uL (ref 1.4–7.7)
Neutrophils Relative %: 68.9 % (ref 43.0–77.0)
Platelets: 203 10*3/uL (ref 150.0–400.0)
RBC: 4.77 Mil/uL (ref 4.22–5.81)
RDW: 13.1 % (ref 11.5–14.6)
WBC: 6.2 10*3/uL (ref 4.5–10.5)

## 2012-06-12 LAB — BASIC METABOLIC PANEL
BUN: 11 mg/dL (ref 6–23)
CO2: 27 mEq/L (ref 19–32)
Calcium: 9 mg/dL (ref 8.4–10.5)
Chloride: 106 mEq/L (ref 96–112)
Creatinine, Ser: 0.8 mg/dL (ref 0.4–1.5)
GFR: 102.12 mL/min (ref >60.00)
Glucose, Bld: 105 mg/dL — ABNORMAL HIGH (ref 70–99)
Potassium: 3.8 mEq/L (ref 3.5–5.1)
Sodium: 139 mEq/L (ref 135–145)

## 2012-06-12 MED ORDER — APIXABAN 5 MG PO TABS: 5.0000 mg | ORAL_TABLET | Freq: Two times a day (BID) | ORAL | Status: DC

## 2012-06-12 NOTE — Patient Instructions (Addendum)
Stay on your current medicines except stop your Pradaxa. We are going to change you to Eliquis 5 mg two times a day. This should be a safer option for you.   We need to check labs today.   Stay active  Dr. Swaziland will see you back in 6 months  Call the [facility] office at [phone] if you have any questions, problems or concerns.

## 2012-06-12 NOTE — Progress Notes (Signed)
Kevin Potter Date of Birth: 15-Jun-1931 Medical Record #[id number]  History of Present Illness: Kevin Potter is seen for a follow up visit. He is seen for Dr. Swaziland. He has not been seen since June of 2012. He has chronic atrial flutter. Intolerant to Rythmol and failed on past cardioversion back in 2008. He is managed with rate control and anticoagulation. Other issues are as noted below. His CHADs would at least be "2"  (age and HTN).   He comes in today. He is here alone. He says he is doing ok except for his balance. He has had about 4 falls over the past 6 months, last was one week ago. No significant injury per his report. No chest pain. He feels pretty good. Not dizzy or lightheaded. No syncope. He remains on Pradaxa. He is asking about Eliquis. Sees Dr. Waynard Edwards for his primary care.   Current Outpatient Prescriptions on File Prior to Visit  Medication Sig Dispense Refill  . fluticasone (FLONASE) 50 MCG/ACT nasal spray Place 1 spray into the nose daily.       Marland Kitchen LORazepam (ATIVAN) 1 MG tablet Take 1 mg by mouth daily.        Marland Kitchen omeprazole (PRILOSEC) 40 MG capsule Take 1 capsule (40 mg total) by mouth daily.  30 capsule  11  . propranolol (INDERAL) 20 MG tablet Take 20 mg by mouth 3 (three) times daily.        . trihexyphenidyl (ARTANE) 2 MG tablet Take 2 mg by mouth 3 (three) times daily with meals.        Marland Kitchen apixaban (ELIQUIS) 5 MG TABS tablet Take 1 tablet (5 mg total) by mouth 2 (two) times daily.  60 tablet  6    No Known Allergies  Past Medical History  Diagnosis Date  . Atrial flutter   . Chronic anticoagulation   . Tremor   . Hypertension   . Sleep apnea   . Hypothyroidism   . Goiter   . Erectile dysfunction   . Hyperthyroidism   . BPH (benign prostatic hyperplasia)   . 1St degree AV block   . Gallstones   . Colonic adenoma   . Chicken pox   . Gilbert's syndrome   . Diverticulosis   . Colon polyps   . Hemorrhoids   . Hiatal hernia     Past Surgical History    Procedure Date  . Cardioversion 03/15/2007    elective DC/x1 with joint pads in the anterior & posterior position/no complications  . Appendectomy   . Nasal septum surgery   . Tonsillectomy     with uvula removed  . Left ankle surgery   . Uva   . Palate / uvula biopsy / excision     History  Smoking status  . Never Smoker   Smokeless tobacco  . Never Used    History  Alcohol Use  . Yes    Comment: red wine at night    Family History  Problem Relation Age of Onset  . Cancer Mother     unknown type  . Cancer Sister     unknown type  . Heart disease Mother   . Heart disease Father   . Heart disease Brother     Review of Systems: The review of systems is per the HPI.  All other systems were reviewed and are negative.  Physical Exam: BP 126/84  Pulse 66  Ht 6\' 2"  (1.88 m)  Wt 211 lb (95.709 kg)  BMI 27.09 kg/m2 Patient is very pleasant and in no acute distress. He has a generalized tremor. Skin is warm and dry. Color is normal.  HEENT is unremarkable. Normocephalic/atraumatic. PERRL. Sclera are nonicteric. Neck is supple. No masses. No JVD. Lungs are clear. Cardiac exam shows an irregular rhythm. His rate is controlled.  Abdomen is soft. Extremities are without edema. Gait and ROM are intact. No gross neurologic deficits noted.  LABORATORY DATA: EKG today shows atrial fluter with variable AV block. Rate is controlled at 84 BPM. There are inferolateral T wave changes.   Lab Results  Component Value Date   WBC 9.9 06/18/2009   HGB 14.7 06/18/2009   HCT 42.7 06/18/2009   PLT 179 06/18/2009   GLUCOSE 107* 06/18/2009   ALT 23 06/18/2009   AST 24 06/18/2009   NA 137 06/18/2009   K 3.6 06/18/2009   CL 102 06/18/2009   CREATININE 0.76 06/18/2009   BUN 12 06/18/2009   CO2 28 06/18/2009   INR 3.12* 06/21/2009    Assessment & Plan:  1. Chronic atrial flutter - he is managed with rate control and anticoagulation. His balance and falls are concerning. We will switch him over to  Eliquis 5 mg BID which should be a safer option for him. Samples are provided for him today and prescription is sent to the drugstore. Check baseline labs today as well. Tentatively see him back in 6 months.   2. HTN - blood pressure looks good. No change in his current regimen.   Patient is agreeable to this plan and will call if any problems develop in the interim.

## 2012-07-02 ENCOUNTER — Telehealth: Payer: Self-pay

## 2012-07-02 NOTE — Telephone Encounter (Signed)
[facility] called Eliquis approved for 1 year.Case # [id number].Patient called left message on personal voice mail.[employer] called.

## 2012-09-10 ENCOUNTER — Telehealth: Payer: Self-pay | Admitting: Cardiology

## 2012-09-10 NOTE — Telephone Encounter (Signed)
New Problem:    Patient called in wanting to know what the status was on the physician's approval letter that he faxed in to our office 2-3 days ago for his Eliqus was.  Please call back.

## 2012-09-10 NOTE — Telephone Encounter (Signed)
Returned call to patient was told [facility] called 07/02/12 and Eliquis approved for 1 year.

## 2012-09-19 ENCOUNTER — Telehealth: Payer: Self-pay | Admitting: Cardiology

## 2012-09-19 NOTE — Telephone Encounter (Signed)
Returned call to patient he stated he needed a prior authorization for eliquis.[facility] [id number] called ID # [id number] eliquis approved until 09/17/13. Eliquis 5 mg samples left at 3rd floor front desk.

## 2012-09-19 NOTE — Telephone Encounter (Signed)
Follow Up   Pt calling in following up on ELIQUIS new prescription. Would like to speak to nurse.

## 2012-09-24 ENCOUNTER — Other Ambulatory Visit: Payer: Self-pay

## 2012-10-05 ENCOUNTER — Other Ambulatory Visit: Payer: Self-pay | Admitting: Internal Medicine

## 2012-10-05 DIAGNOSIS — E049 Nontoxic goiter, unspecified: Secondary | ICD-10-CM

## 2012-10-09 ENCOUNTER — Other Ambulatory Visit: Payer: Medicare Other

## 2012-10-11 ENCOUNTER — Ambulatory Visit
Admission: RE | Admit: 2012-10-11 | Discharge: 2012-10-11 | Disposition: A | Discharge status: Home / Self Care | Payer: Medicare Other | Source: Ambulatory Visit | Attending: Internal Medicine | Admitting: Internal Medicine

## 2012-10-11 DIAGNOSIS — E049 Nontoxic goiter, unspecified: Secondary | ICD-10-CM

## 2012-10-11 MED ORDER — IOHEXOL 300 MG/ML  SOLN
75.0000 mL | Freq: Once | INTRAMUSCULAR | Status: AC | PRN
  Administered 2012-10-11: 75 mL via INTRAVENOUS

## 2012-10-24 ENCOUNTER — Other Ambulatory Visit (HOSPITAL_COMMUNITY): Payer: Self-pay | Admitting: Internal Medicine

## 2012-10-24 DIAGNOSIS — E049 Nontoxic goiter, unspecified: Secondary | ICD-10-CM

## 2012-11-12 ENCOUNTER — Encounter (HOSPITAL_COMMUNITY)
Admission: RE | Admit: 2012-11-12 | Discharge: 2012-11-12 | Disposition: A | Discharge status: Home / Self Care | Payer: Medicare Other | Source: Ambulatory Visit | Attending: Internal Medicine | Admitting: Internal Medicine

## 2012-11-12 ENCOUNTER — Other Ambulatory Visit (HOSPITAL_COMMUNITY): Payer: Self-pay | Admitting: Internal Medicine

## 2012-11-12 DIAGNOSIS — E049 Nontoxic goiter, unspecified: Secondary | ICD-10-CM

## 2012-11-13 ENCOUNTER — Encounter (HOSPITAL_COMMUNITY)
Admission: RE | Admit: 2012-11-13 | Discharge: 2012-11-13 | Disposition: A | Discharge status: Home / Self Care | Payer: Medicare Other | Source: Ambulatory Visit | Attending: Internal Medicine | Admitting: Internal Medicine

## 2012-11-13 DIAGNOSIS — E049 Nontoxic goiter, unspecified: Secondary | ICD-10-CM

## 2012-11-13 MED ORDER — SODIUM IODIDE I 131 CAPSULE
9.0000 | Freq: Once | INTRAVENOUS | Status: AC | PRN
  Administered 2012-11-12: 9 via ORAL

## 2012-11-13 MED ORDER — SODIUM PERTECHNETATE TC 99M INJECTION
10.4000 | Freq: Once | INTRAVENOUS | Status: AC | PRN
  Administered 2012-11-13: 10.4 via INTRAVENOUS

## 2012-12-14 ENCOUNTER — Encounter: Payer: Self-pay | Admitting: Cardiology

## 2012-12-14 ENCOUNTER — Ambulatory Visit (INDEPENDENT_AMBULATORY_CARE_PROVIDER_SITE_OTHER): Payer: Medicare Other | Admitting: Cardiology

## 2012-12-14 VITALS — BP 114/72 | HR 58 | Ht 74.0 in | Wt 206.8 lb

## 2012-12-14 DIAGNOSIS — R0609 Other forms of dyspnea: Secondary | ICD-10-CM

## 2012-12-14 DIAGNOSIS — I4892 Unspecified atrial flutter: Secondary | ICD-10-CM

## 2012-12-14 DIAGNOSIS — R0600 Dyspnea, unspecified: Secondary | ICD-10-CM | POA: Insufficient documentation

## 2012-12-14 DIAGNOSIS — I10 Essential (primary) hypertension: Secondary | ICD-10-CM

## 2012-12-14 DIAGNOSIS — R0989 Other specified symptoms and signs involving the circulatory and respiratory systems: Secondary | ICD-10-CM

## 2012-12-14 DIAGNOSIS — Z7901 Long term (current) use of anticoagulants: Secondary | ICD-10-CM

## 2012-12-14 NOTE — Progress Notes (Signed)
Hilbert Bible Date of Birth: Oct 25, 1931 Medical Record #[id number]  History of Present Illness: Mr. Kevin Potter is seen for a follow up visit.  He has chronic atrial flutter. Intolerant to Rythmol and failed on past cardioversion back in 2008. In 2008 he did have a Myoview study which showed no ischemia and an ejection fraction of 47%. Echocardiogram at that time also showed an ejection fraction of 45-50%. He is managed with rate control and anticoagulation. His CHADs would at least be "2"  (age and HTN). He is now on Eliquis. On followup today he reports that he is having some shortness of breath when he walks up hills. He also gets fatigued. He does have some swelling in his legs if he sits for a long period of time. He is being evaluated for an enlarged thyroid goiter. He had recent CT scan and thyroid scans done. He denies any chest pain.  Current Outpatient Prescriptions on File Prior to Visit  Medication Sig Dispense Refill  . apixaban (ELIQUIS) 5 MG TABS tablet Take 1 tablet (5 mg total) by mouth 2 (two) times daily.  60 tablet  6  . fluticasone (FLONASE) 50 MCG/ACT nasal spray Place 1 spray into the nose daily.       Marland Kitchen LORazepam (ATIVAN) 1 MG tablet Take 1 mg by mouth daily.        Marland Kitchen omeprazole (PRILOSEC) 40 MG capsule Take 1 capsule (40 mg total) by mouth daily.  30 capsule  11  . propranolol (INDERAL) 20 MG tablet Take 20 mg by mouth 3 (three) times daily.        . trihexyphenidyl (ARTANE) 2 MG tablet Take 2 mg by mouth 3 (three) times daily with meals.         No current facility-administered medications on file prior to visit.    No Known Allergies  Past Medical History  Diagnosis Date  . Atrial flutter   . Chronic anticoagulation   . Tremor   . Hypertension   . Sleep apnea   . Hypothyroidism   . Goiter   . Erectile dysfunction   . Hyperthyroidism   . BPH (benign prostatic hyperplasia)   . 1St degree AV block   . Gallstones   . Colonic adenoma   . Chicken pox   .  Gilbert's syndrome   . Diverticulosis   . Colon polyps   . Hemorrhoids   . Hiatal hernia     Past Surgical History  Procedure Laterality Date  . Cardioversion  03/15/2007    elective DC/x1 with joint pads in the anterior & posterior position/no complications  . Appendectomy    . Nasal septum surgery    . Tonsillectomy      with uvula removed  . Left ankle surgery    . Uva    . Palate / uvula biopsy / excision      History  Smoking status  . Never Smoker   Smokeless tobacco  . Never Used    History  Alcohol Use  . Yes    Comment: red wine at night    Family History  Problem Relation Age of Onset  . Cancer Mother     unknown type  . Cancer Sister     unknown type  . Heart disease Mother   . Heart disease Father   . Heart disease Brother     Review of Systems: The review of systems is per the HPI.  All other systems were reviewed and  are negative.  Physical Exam: BP 114/72  Pulse 58  Ht 6\' 2"  (1.88 m)  Wt 206 lb 40.9 oz (93.804 kg)  BMI 26.54 kg/m2 Patient is very pleasant and in no acute distress. He has a generalized tremor. Skin is warm and dry. Color is normal.  HEENT is unremarkable. Normocephalic/atraumatic. PERRL. Sclera are nonicteric. Neck is supple. No masses. No JVD. Lungs are clear. Cardiac exam shows an irregular rhythm. His rate is controlled.  Abdomen is soft. Extremities are without edema. Gait and ROM are intact. No gross neurologic deficits noted.  LABORATORY DATA: E  Lab Results  Component Value Date   WBC 6.2 06/12/2012   HGB 15.1 06/12/2012   HCT 44.1 06/12/2012   PLT 203.0 06/12/2012   GLUCOSE 105* 06/12/2012   ALT 23 06/18/2009   AST 24 06/18/2009   NA 139 06/12/2012   K 3.8 06/12/2012   CL 106 06/12/2012   CREATININE 0.8 06/12/2012   BUN 11 06/12/2012   CO2 27 06/12/2012   INR 3.12* 06/21/2009    Assessment & Plan:  1. Chronic atrial flutter - he is managed with rate control and anticoagulation. Continue Eliquis 5 mg BID which should be a safer  option for him. Renal function was normal in January. Continue propranolol for rate control.  2. HTN - blood pressure looks good. No change in his current regimen.   3. Dyspnea on exertion. It is unclear whether this is cardiac related or may be related to his large goiter. I recommended updating his echocardiogram. If ejection fraction has dropped may need to consider adding an ACE inhibitor and/or diuretic.

## 2012-12-14 NOTE — Patient Instructions (Signed)
We will schedule you for an Echocardiogram  Continue your current therapy   

## 2012-12-18 ENCOUNTER — Ambulatory Visit (HOSPITAL_COMMUNITY): Payer: Medicare Other | Attending: Cardiovascular Disease

## 2012-12-18 DIAGNOSIS — I079 Rheumatic tricuspid valve disease, unspecified: Secondary | ICD-10-CM | POA: Insufficient documentation

## 2012-12-18 DIAGNOSIS — R0602 Shortness of breath: Secondary | ICD-10-CM

## 2012-12-18 DIAGNOSIS — R0600 Dyspnea, unspecified: Secondary | ICD-10-CM

## 2012-12-18 DIAGNOSIS — R0609 Other forms of dyspnea: Secondary | ICD-10-CM | POA: Insufficient documentation

## 2012-12-18 DIAGNOSIS — I080 Rheumatic disorders of both mitral and aortic valves: Secondary | ICD-10-CM | POA: Insufficient documentation

## 2012-12-18 DIAGNOSIS — Z7901 Long term (current) use of anticoagulants: Secondary | ICD-10-CM

## 2012-12-18 DIAGNOSIS — R5381 Other malaise: Secondary | ICD-10-CM | POA: Insufficient documentation

## 2012-12-18 DIAGNOSIS — R0989 Other specified symptoms and signs involving the circulatory and respiratory systems: Secondary | ICD-10-CM | POA: Insufficient documentation

## 2012-12-18 DIAGNOSIS — I10 Essential (primary) hypertension: Secondary | ICD-10-CM | POA: Insufficient documentation

## 2012-12-18 DIAGNOSIS — I4892 Unspecified atrial flutter: Secondary | ICD-10-CM | POA: Insufficient documentation

## 2012-12-18 NOTE — Progress Notes (Signed)
Echocardiogram performed.  

## 2013-01-16 ENCOUNTER — Encounter: Payer: Self-pay | Admitting: Cardiology

## 2013-01-17 ENCOUNTER — Other Ambulatory Visit: Payer: Self-pay | Admitting: Internal Medicine

## 2013-01-22 ENCOUNTER — Telehealth: Payer: Self-pay

## 2013-01-22 MED ORDER — OMEPRAZOLE 40 MG PO CPDR: 40.0000 mg | DELAYED_RELEASE_CAPSULE | Freq: Every day | ORAL | Status: DC

## 2013-01-22 NOTE — Telephone Encounter (Signed)
Refilled Omeprazole 

## 2013-03-13 ENCOUNTER — Other Ambulatory Visit: Payer: Self-pay | Admitting: Nurse Practitioner

## 2013-03-15 ENCOUNTER — Other Ambulatory Visit: Payer: Self-pay | Admitting: Nurse Practitioner

## 2013-03-21 ENCOUNTER — Other Ambulatory Visit: Payer: Self-pay | Admitting: Nurse Practitioner

## 2013-04-05 ENCOUNTER — Other Ambulatory Visit: Payer: Self-pay | Admitting: Nurse Practitioner

## 2013-04-11 ENCOUNTER — Encounter (INDEPENDENT_AMBULATORY_CARE_PROVIDER_SITE_OTHER): Payer: Self-pay

## 2013-04-11 ENCOUNTER — Ambulatory Visit (INDEPENDENT_AMBULATORY_CARE_PROVIDER_SITE_OTHER): Payer: Medicare Other | Admitting: Neurology

## 2013-04-11 ENCOUNTER — Encounter: Payer: Self-pay | Admitting: Neurology

## 2013-04-11 VITALS — BP 147/82 | HR 45 | Temp 97.8°F | Ht 73.0 in | Wt 203.0 lb

## 2013-04-11 DIAGNOSIS — G252 Other specified forms of tremor: Secondary | ICD-10-CM

## 2013-04-11 DIAGNOSIS — G243 Spasmodic torticollis: Secondary | ICD-10-CM

## 2013-04-11 DIAGNOSIS — G250 Essential tremor: Secondary | ICD-10-CM

## 2013-04-11 DIAGNOSIS — R2689 Other abnormalities of gait and mobility: Secondary | ICD-10-CM

## 2013-04-11 DIAGNOSIS — R29818 Other symptoms and signs involving the nervous system: Secondary | ICD-10-CM

## 2013-04-11 MED ORDER — TRIHEXYPHENIDYL HCL 2 MG PO TABS: 2.0000 mg | ORAL_TABLET | Freq: Three times a day (TID) | ORAL | Status: DC

## 2013-04-11 NOTE — Patient Instructions (Addendum)
  Please remember, that any kind of tremor may be exacerbated by anxiety, anger, nervousness, excitement, dehydration, sleep deprivation, by caffeine, and low blood sugar values or blood sugar fluctuations. Some medications, especially some antidepressants and lithium can cause or exacerbate tremors. Tremors may temporarily calm down her subside with the use of a benzodiazepine such as Valium or related medications and with alcohol. Be aware however that drinking alcohol is not an approved treatment or appropriate treatment for tremor control and long-term use of benzodiazepines such as Valium, lorazepam, alprazolam, or clonazepam can cause habit formation, physical and psychological addiction.  I think overall you are doing fairly well and are stable at this point.   I do have some generic suggestions for you today:   Please use your cane for safety.   Please make sure that you drink plenty of fluids. I would like for you to exercise daily for example in the form of walking 20-30 minutes every day, if you can. Please keep a regular sleep-wake schedule, keep regular meal times, do not skip any meals, eat  healthy snacks in between meals, such as fruit or nuts. Try to eat protein with every meal.   Engage in social activities in your community and with your family and try to keep up with current events by reading the newspaper or watching the news.  I do not think we need to make any changes in your medications at this point. I think you're stable enough that I can see you back in 12 months, sooner if we need to. Please call us if you have any interim questions, concerns, or problems or updates to need to discuss.  Brett Canales is my clinical assistant and will answer any of your questions and relay your messages to me and will give you my messages.   Our phone number is [phone]. We also have an after hours call service for urgent matters and there is a physician on-call for urgent questions. For any  emergencies you know to call 911 or go to the nearest emergency room.

## 2013-04-11 NOTE — Progress Notes (Signed)
Subjective:    Patient ID: ADITH TEJADA is a 79 y.o. male.  HPI  Huston Foley, MD, PhD [facility] [address], [address] [address] [place], [place] [address]  Dear Dr. Waynard Edwards,  I saw your patient, Jeramine Delis, upon your kind request in my neurologic clinic today for ongoing consultation of his essential tremor affecting mostly his head. The patient is unaccompanied today. As you know, Mr. Blissett is a very pleasant 79 year old right-handed gentleman with an underlying medical history of heart disease, obstructive sleep apnea not on CPAP, hypothyroidism, hypertension, erectile dysfunction, atrial flutter diagnosed in 2007, BPH, allergic rhinitis, low back pain, osteoarthritis, who was diagnosed with essential tremor several years ago by his internist. 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 He previously saw Dr. Fayrene Fearing love and was last seen by him in 2012. On 03/06/2012 he was seen by our nurse practitioner Ms. Daphine Deutscher and essentially presents for his yearly checkup. He was deemed stable at the time of his last appointment and was kept on the same medication. He has been taking Artane 2 mg 3 times a day which helps his hand and head tremor. He had some blood work in August of this year which I reviewed: His TSH was low at 0.04, free T4 was normal and total T3 was also in the normal range, PSA was mildly elevated at 4.811. He has been on Artane for several years. He believes, it is still helpful. He denies side effects. He does not note much in the way of progression. He has not been on Mysoline and has been on propranolol for years. He has noted no problems with fine motor skills.  Several years ago, he had Botox injections into the neck, but he did not like it, as the injections were painful and he did not see any benefit. He has noticed a mild change in his balance. He has started using a cane but not consistently. He used to play tennis regularly but now is not  playing. He walks for exercise every day. Over the course of the last few years he has fallen a few times but thankfully has not injured himself.  His Past Medical History Is Significant For: Past Medical History  Diagnosis Date  . Atrial flutter   . Chronic anticoagulation   . Tremor   . Hypertension   . Sleep apnea   . Hypothyroidism   . Goiter   . Erectile dysfunction   . Hyperthyroidism   . BPH (benign prostatic hyperplasia)   . 1St degree AV block   . Gallstones   . Colonic adenoma   . Chicken pox   . Gilbert's syndrome   . Diverticulosis   . Colon polyps   . Hemorrhoids   . Hiatal hernia     His Past Surgical History Is Significant For: Past Surgical History  Procedure Laterality Date  . Cardioversion  03/15/2007    elective DC/x1 with joint pads in the anterior & posterior position/no complications  . Appendectomy    . Nasal septum surgery    . Tonsillectomy      with uvula removed  . Left ankle surgery    . Uva    . Palate / uvula biopsy / excision      His Family History Is Significant For: Family History  Problem Relation Age of Onset  . Cancer Mother     unknown type  . Cancer Sister     unknown type  .  Heart disease Mother   . Heart disease Father   . Heart disease Brother     His Social History Is Significant For: History   Social History  . Marital Status: Married    Spouse Name: N/A    Number of Children: 2  . Years of Education: N/A   Occupational History  . financial planner    Social History Main Topics  . Smoking status: Never Smoker   . Smokeless tobacco: Never Used  . Alcohol Use: Yes     Comment: red wine at night  . Drug Use: No  . Sexual Activity: Not Currently   Other Topics Concern  . None   Social History Narrative  . None    His Allergies Are:  No Known Allergies:   His Current Medications Are:  Outpatient Encounter Prescriptions as of 04/11/2013  Medication Sig  . ELIQUIS 5 MG TABS tablet TAKE 1 TABLET BY  MOUTH TWICE DAILY  . fluticasone (FLONASE) 50 MCG/ACT nasal spray Place 1 spray into the nose daily.   Marland Kitchen FLUZONE HIGH-DOSE injection   . LORazepam (ATIVAN) 1 MG tablet Take 1 mg by mouth daily.    Marland Kitchen omeprazole (PRILOSEC) 40 MG capsule Take 1 capsule (40 mg total) by mouth daily.  . propranolol (INDERAL) 20 MG tablet TAKE 1 TABLET BY MOUTH THREE TIMES DAILY  . trihexyphenidyl (ARTANE) 2 MG tablet TAKE 1 TABLET BY MOUTH THREE TIMES DAILY    Review of Systems:  Out of a complete 14 point review of systems, all are reviewed and negative with the exception of these symptoms as listed below:  Review of Systems  Constitutional: Negative.   HENT: Positive for tinnitus.   Eyes: Positive for visual disturbance (blurred).  Respiratory: Negative.   Cardiovascular: Negative.   Gastrointestinal: Negative.   Endocrine: Negative.   Genitourinary:       Impotence  Musculoskeletal: Negative.   Skin: Negative.   Allergic/Immunologic: Negative.   Neurological: Positive for tremors.  Hematological: Bruises/bleeds easily.  Psychiatric/Behavioral: Negative.     Objective:  Neurologic Exam  Physical Exam Physical Examination:   Filed Vitals:   04/11/13 1313  BP: 147/82  Pulse: 45  Temp: 97.8 F (36.6 C)   General Examination: The patient is a very pleasant 79 y.o. male in no acute distress. He appears well-developed and well-nourished and well groomed.   HEENT: Normocephalic, atraumatic, pupils are equal, round and reactive to light and accommodation. Extraocular tracking is good without limitation to gaze excursion or nystagmus noted. Normal smooth pursuit is noted. Hearing is grossly intact. Face is symmetric with normal facial animation and normal facial sensation. Speech is clear with no dysarthria noted. There is no hypophonia. There is a mild, constant, side-to-side head tremor. Neck is moderately rigid with decrease in active and passive range of mobility. He has evidence of anterocollis,  left laterocollis and a slight right torticollis. 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 He has no voice tremor. There are no carotid bruits on auscultation. Oropharynx exam reveals: mild mouth dryness, adequate dental hygiene and moderate airway crowding, due to larger tongue and narrow airway. He had a Uvulectomy and TE. Mallampati is class II. Tongue protrudes centrally and palate elevates symmetrically.   Chest: Clear to auscultation without wheezing, rhonchi or crackles noted.  Heart: S1+S2+0, regular and normal without murmurs, rubs or gallops noted.   Abdomen: Soft, non-tender and non-distended with normal bowel sounds appreciated on auscultation.  Extremities: There is 1+  pitting edema in the distal lower extremities bilaterally. Pedal pulses are intact.  Skin: Warm and dry without trophic changes noted. There are no varicose veins.  Musculoskeletal: exam reveals no obvious joint deformities, tenderness or joint swelling or erythema.   Neurologically:  Mental status: The patient is awake, alert and oriented in all 4 spheres. His memory, attention, language and knowledge are appropriate. There is no aphasia, agnosia, apraxia or anomia. Speech is clear with normal prosody and enunciation. Thought process is linear. Mood is congruent and affect is normal.  Cranial nerves are as described above under HEENT exam. In addition, shoulder shrug is normal with equal shoulder height noted. Motor exam: Normal bulk, strength and tone is noted. There is no drift, or rebound. There is no resting tremor. There is no significant upper extremity postural or action tremor. There tremor frequency is fairly fast and the amplitude is small. On Archimedes spiral drawing there is mild tremulousness noted. Handwriting is Minimally tremulous and legible. There is no evidence of micrographia.  Reflexes are 2+ throughout. Fine motor skills are intact with normal finger taps, normal hand movements,  normal rapid alternating patting, normal foot taps and normal foot agility.  Cerebellar testing shows no dysmetria or intention tremor on finger to nose testing. Heel to shin is unremarkable bilaterally. There is no truncal or gait ataxia.  Sensory exam is intact to light touch, pinprick, vibration, temperature sense and proprioception in the upper and lower extremities.  Gait, station and balance: He stands with mild difficulty and pushes himself up. His stance is narrow based but he is more comfortable standing a little wide-based. He walks with preserved arm swing bilaterally but has slight difficulty turning. While Romberg is negative for a corrective steps but he does have a little bit of swaying.   Assessment and Plan:    In summary, NOLON YELLIN is a very pleasant 79 y.o.-year old male with a history of head tremor. His physical exam is most consistent with cervical dystonia and dystonic head tremor as opposed to essential tremor but it is difficult to tease out at this time. He does not have a family history of essential tremor. He does not really have much in the way of hand tremors and does not report fine motor difficulties. He does have a mild problem with his balance, which may be a function of medication side effect, advancing age, as well as underlying tremor disorder. I offered a referral to outpatient physical therapy but he declined at this time and would like to think about it first. He is advised to use his cane for safety at all times. As far as symptomatic treatment is concerned. He has tried Botox injections in the past but does not recall that they were helpful and actually did not feel good without. He had pain with the injections and is not keen on trying does again. He has had some success with trihexyphenidyl and denies any major side effects. To that and, I discussed with him continuation of the same medical regimen. He was in agreement. Otherwise his physical exam is benign.  I  answered all his questions today and the patient was in agreement with the above outlined plan. I would like to see the patient back in 12 months, sooner if the need arises and encouraged him to call with any interim questions, concerns, problems or updates and refill requests. I did refill his Artane prescription. Thank you very much for allowing me to participate in the  care of this nice patient. If I can be of any further assistance to you please do not hesitate to call me at [phone].  Sincerely,   Star Age, MD, PhD

## 2013-04-12 ENCOUNTER — Other Ambulatory Visit: Payer: Self-pay | Admitting: Nurse Practitioner

## 2013-05-09 ENCOUNTER — Telehealth: Payer: Self-pay

## 2013-05-09 NOTE — Telephone Encounter (Signed)
Spoke to [facility] patient has a mass in left upper jaw that needs biopsied,they need to know if ok to hold Eliquis and for how long and when ok to resume.Message sent to Dr.Jordan for advice.

## 2013-05-09 NOTE — Telephone Encounter (Signed)
May stop Eliquis 2 days before biopsy.  Peter Swaziland MD, [facility]

## 2013-05-10 NOTE — Telephone Encounter (Signed)
[facility] called left message on Allison's personal voice mail, Dr.Jordan advised ok for patient to hold Eliquis 2 days prior to biopsy.

## 2013-05-15 ENCOUNTER — Other Ambulatory Visit: Payer: Self-pay | Admitting: Internal Medicine

## 2013-06-12 ENCOUNTER — Other Ambulatory Visit: Payer: Self-pay | Admitting: Internal Medicine

## 2013-06-26 ENCOUNTER — Encounter: Payer: Self-pay | Admitting: Cardiology

## 2013-06-26 ENCOUNTER — Ambulatory Visit (INDEPENDENT_AMBULATORY_CARE_PROVIDER_SITE_OTHER): Payer: Medicare Other | Admitting: Cardiology

## 2013-06-26 VITALS — BP 122/74 | HR 87 | Ht 74.0 in | Wt 207.8 lb

## 2013-06-26 DIAGNOSIS — I4892 Unspecified atrial flutter: Secondary | ICD-10-CM

## 2013-06-26 DIAGNOSIS — R0989 Other specified symptoms and signs involving the circulatory and respiratory systems: Secondary | ICD-10-CM

## 2013-06-26 DIAGNOSIS — R0600 Dyspnea, unspecified: Secondary | ICD-10-CM

## 2013-06-26 DIAGNOSIS — Z7901 Long term (current) use of anticoagulants: Secondary | ICD-10-CM

## 2013-06-26 DIAGNOSIS — R0609 Other forms of dyspnea: Secondary | ICD-10-CM

## 2013-06-26 NOTE — Patient Instructions (Signed)
Continue your current therapy  I will see you in 6 months.   

## 2013-06-26 NOTE — Progress Notes (Signed)
Kevin Potter Date of Birth: 07/09/1931 Medical Record #[id number]  History of Present Illness: Kevin Potter is seen for a follow up visit.  He has chronic atrial flutter. Intolerant to Rythmol and failed on past cardioversion back in 2008. In 2008 he did have a Myoview study which showed no ischemia and an ejection fraction of 47%. Echocardiogram at that time also showed an ejection fraction of 45-50%. He is managed with rate control and anticoagulation. His CHADs would at least be "2"  (age and HTN). He is now on Eliquis. On his last visit he was experiencing dyspnea. Repeat Echo showed normal LV function with EF 55-65%. Dyspnea has improved. He denies chest pain or dizziness. He does have a large goiter that is being followed. He recently has been treated for an oral abcess and is going to need repeat drainage by his dental surgeon.  Current Outpatient Prescriptions on File Prior to Visit  Medication Sig Dispense Refill  . ELIQUIS 5 MG TABS tablet TAKE 1 TABLET BY MOUTH TWICE DAILY  60 tablet  5  . fluticasone (FLONASE) 50 MCG/ACT nasal spray Place 1 spray into the nose daily.       Marland Kitchen FLUZONE HIGH-DOSE injection       . LORazepam (ATIVAN) 1 MG tablet Take 1 mg by mouth daily.        Marland Kitchen omeprazole (PRILOSEC) 40 MG capsule TAKE 1 CAPSULE (40 MG TOTAL) BY MOUTH DAILY.  30 capsule  2  . omeprazole (PRILOSEC) 40 MG capsule TAKE 1 CAPSULE (40 MG TOTAL) BY MOUTH DAILY.  30 capsule  2  . propranolol (INDERAL) 20 MG tablet TAKE 1 TABLET BY MOUTH THREE TIMES DAILY *NEED APPOINTMENT FOR FURTHER REFILLS, PER MD*  270 tablet  3  . trihexyphenidyl (ARTANE) 2 MG tablet Take 1 tablet (2 mg total) by mouth 3 (three) times daily with meals.  90 tablet  11   No current facility-administered medications on file prior to visit.    No Known Allergies  Past Medical History  Diagnosis Date  . Atrial flutter   . Chronic anticoagulation   . Tremor   . Hypertension   . Sleep apnea   . Hypothyroidism   . Goiter    . Erectile dysfunction   . Hyperthyroidism   . BPH (benign prostatic hyperplasia)   . 1St degree AV block   . Gallstones   . Colonic adenoma   . Chicken pox   . Gilbert's syndrome   . Diverticulosis   . Colon polyps   . Hemorrhoids   . Hiatal hernia     Past Surgical History  Procedure Laterality Date  . Cardioversion  03/15/2007    elective DC/x1 with joint pads in the anterior & posterior position/no complications  . Appendectomy    . Nasal septum surgery    . Tonsillectomy      with uvula removed  . Left ankle surgery    . Uva    . Palate / uvula biopsy / excision      History  Smoking status  . Never Smoker   Smokeless tobacco  . Never Used    History  Alcohol Use  . Yes    Comment: red wine at night    Family History  Problem Relation Age of Onset  . Cancer Mother     unknown type  . Cancer Sister     unknown type  . Heart disease Mother   . Heart disease Father   .  Heart disease Brother     Review of Systems: The review of systems is per the HPI.  All other systems were reviewed and are negative.  Physical Exam: BP 122/74  Pulse 87  Ht 6\' 2"  (1.88 m)  Wt 207 lb 12.8 oz (94.257 kg)  BMI 26.67 kg/m2 Patient is very pleasant and in no acute distress. He has a mild generalized tremor. Skin is warm and dry. Color is normal.  HEENT is unremarkable. Normocephalic/atraumatic. PERRL. Sclera are nonicteric. Neck is supple. Large goiter. No JVD. Lungs are clear. Cardiac exam shows an irregular rhythm. His rate is controlled.  Abdomen is soft. Extremities are without edema. Gait and ROM are intact. No gross neurologic deficits noted.  LABORATORY DATA: E  Lab Results  Component Value Date   WBC 6.2 06/12/2012   HGB 15.1 06/12/2012   HCT 44.1 06/12/2012   PLT 203.0 06/12/2012   GLUCOSE 105* 06/12/2012   ALT 23 06/18/2009   AST 24 06/18/2009   NA 139 06/12/2012   K 3.8 06/12/2012   CL 106 06/12/2012   CREATININE 0.8 06/12/2012   BUN 11 06/12/2012   CO2 27 06/12/2012    INR 3.12* 06/21/2009   Ecg shows atrial flutter with rate 89 bpm. NSIVCD.  Assessment & Plan:  1. Chronic atrial flutter - he is managed with rate control and anticoagulation. Continue Eliquis 5 mg BID which should be a safer option for him.  2. HTN - blood pressure looks good. No change in his current regimen.   3. Dyspnea on exertion. Last Echo showed a normal EF. Dyspnea resolved.

## 2013-08-05 ENCOUNTER — Other Ambulatory Visit: Payer: Self-pay | Admitting: Dermatology

## 2013-08-05 ENCOUNTER — Telehealth: Payer: Self-pay | Admitting: Neurology

## 2013-08-05 NOTE — Telephone Encounter (Signed)
Pt called and stated that on 2-23 @ 10:22 am he faxed over 4 pages from [facility] regarding the Trihexyphenidyl 2 mg tabs.  He stated that on 08-03-13 he received a letter from [facility] stating that they would not be covering this prescription.  He wanted to know the status of the paperwork that he submitted and wondered if it had just crossed in the mail with the [place] letter.  He also wanted to know if we could file an exception or if we wanted to prescribe him another medication .  Please contact him to discuss and if the information needs to be refaxed.  The best under to reach him is [phone].  He also gave the [employer] number of [phone].  Thank you

## 2013-08-23 ENCOUNTER — Telehealth: Payer: Self-pay

## 2013-08-23 NOTE — Telephone Encounter (Signed)
[employer] sent [place] a letter saying they have decided to overturn the denial, and have approved coverage for Trihexyphenidyl effective until 08/14/2014 Case # [id number]

## 2013-12-09 ENCOUNTER — Ambulatory Visit (INDEPENDENT_AMBULATORY_CARE_PROVIDER_SITE_OTHER): Payer: Medicare Other | Admitting: Cardiology

## 2013-12-09 ENCOUNTER — Encounter: Payer: Self-pay | Admitting: Cardiology

## 2013-12-09 VITALS — BP 120/82 | HR 86 | Ht 74.0 in | Wt 208.9 lb

## 2013-12-09 DIAGNOSIS — I4892 Unspecified atrial flutter: Secondary | ICD-10-CM

## 2013-12-09 DIAGNOSIS — Z7901 Long term (current) use of anticoagulants: Secondary | ICD-10-CM

## 2013-12-09 DIAGNOSIS — I483 Typical atrial flutter: Secondary | ICD-10-CM

## 2013-12-09 DIAGNOSIS — I10 Essential (primary) hypertension: Secondary | ICD-10-CM

## 2013-12-09 MED ORDER — APIXABAN 5 MG PO TABS: 5.0000 mg | ORAL_TABLET | Freq: Two times a day (BID) | ORAL | Status: DC

## 2013-12-09 NOTE — Patient Instructions (Signed)
Continue your current therapy  I will get a copy of your lab work from Dr. Joylene Draft  I will see you in 6 months

## 2013-12-10 NOTE — Progress Notes (Signed)
Julien Girt Date of Birth: [birth year] Medical Record #[id number]  History of Present Illness: Mr. Mormile is seen for a follow up visit.  He has chronic atrial flutter. Intolerant to Rythmol and failed on past cardioversion back in 2008. In 2008 he did have a Myoview study which showed no ischemia and an ejection fraction of 47%. Echocardiogram at that time also showed an ejection fraction of 45-50%. He is managed with rate control and anticoagulation.He is now on Eliquis.  Repeat Echo in July 2014 showed normal LV function with EF 55-65%. Dyspnea has improved. He denies chest pain or dizziness. He does have a large goiter that is being followed. He only notes dyspnea when he is walking up hills. Tries to walk 30-40 minutes a day.  Current Outpatient Prescriptions on File Prior to Visit  Medication Sig Dispense Refill  . fluticasone (FLONASE) 50 MCG/ACT nasal spray Place 1 spray into the nose daily.       Marland Kitchen FLUZONE HIGH-DOSE injection       . LORazepam (ATIVAN) 1 MG tablet Take 1 mg by mouth daily.        Marland Kitchen omeprazole (PRILOSEC) 40 MG capsule TAKE 1 CAPSULE (40 MG TOTAL) BY MOUTH DAILY.  30 capsule  2  . propranolol (INDERAL) 20 MG tablet TAKE 1 TABLET BY MOUTH THREE TIMES DAILY *NEED APPOINTMENT FOR FURTHER REFILLS, PER MD*  270 tablet  3  . trihexyphenidyl (ARTANE) 2 MG tablet Take 1 tablet (2 mg total) by mouth 3 (three) times daily with meals.  90 tablet  11   No current facility-administered medications on file prior to visit.    No Known Allergies  Past Medical History  Diagnosis Date  . Atrial flutter   . Chronic anticoagulation   . Tremor   . Hypertension   . Sleep apnea   . Hypothyroidism   . Goiter   . Erectile dysfunction   . Hyperthyroidism   . BPH (benign prostatic hyperplasia)   . 1St degree AV block   . Gallstones   . Colonic adenoma   . Chicken pox   . Gilbert's syndrome   . Diverticulosis   . Colon polyps   . Hemorrhoids   . Hiatal hernia     Past  Surgical History  Procedure Laterality Date  . Cardioversion  03/15/2007    elective DC/x1 with joint pads in the anterior & posterior position/no complications  . Appendectomy    . Nasal septum surgery    . Tonsillectomy      with uvula removed  . Left ankle surgery    . Uva    . Palate / uvula biopsy / excision      History  Smoking status  . Never Smoker   Smokeless tobacco  . Never Used    History  Alcohol Use  . Yes    Comment: red wine at night    Family History  Problem Relation Age of Onset  . Cancer Mother     unknown type  . Cancer Sister     unknown type  . Heart disease Mother   . Heart disease Father   . Heart disease Brother     Review of Systems: The review of systems is per the HPI.  All other systems were reviewed and are negative.  Physical Exam: BP 120/82  Pulse 86  Ht 6\' 2"  (1.88 m)  Wt 208 lb 14.4 oz (94.756 kg)  BMI 26.81 kg/m2 Patient is very pleasant and  in no acute distress. He has a mild generalized tremor. Skin is warm and dry. Color is normal.  HEENT is unremarkable. Normocephalic/atraumatic. PERRL. Sclera are nonicteric. Neck is supple. Large goiter. No JVD. Lungs are clear. Cardiac exam shows an irregular rhythm. His rate is controlled.  Abdomen is soft. Extremities are without edema. Gait and ROM are intact. No gross neurologic deficits noted.  LABORATORY DATA: E  Lab Results  Component Value Date   WBC 6.2 06/12/2012   HGB 15.1 06/12/2012   HCT 44.1 06/12/2012   PLT 203.0 06/12/2012   GLUCOSE 105* 06/12/2012   ALT 23 06/18/2009   AST 24 06/18/2009   NA 139 06/12/2012   K 3.8 06/12/2012   CL 106 06/12/2012   CREATININE 0.8 06/12/2012   BUN 11 06/12/2012   CO2 27 06/12/2012   INR 3.12* 06/21/2009   Ecg shows atrial flutter with rate 87 bpm. NSIVCD.  Assessment & Plan:  1. Chronic atrial flutter - he is managed with rate control and anticoagulation. Continue Eliquis 5 mg BID  2. HTN - blood pressure looks good. No change in his current  regimen.   3. Dyspnea on exertion. Last Echo showed a normal EF. Continue aerobic activity.

## 2014-02-01 ENCOUNTER — Other Ambulatory Visit: Payer: Self-pay | Admitting: Internal Medicine

## 2014-02-03 ENCOUNTER — Other Ambulatory Visit: Payer: Self-pay | Admitting: Internal Medicine

## 2014-02-14 ENCOUNTER — Encounter: Payer: Self-pay | Admitting: *Deleted

## 2014-02-28 ENCOUNTER — Other Ambulatory Visit: Payer: Self-pay | Admitting: Neurology

## 2014-03-06 ENCOUNTER — Other Ambulatory Visit: Payer: Self-pay | Admitting: Neurology

## 2014-03-06 NOTE — Telephone Encounter (Signed)
Last auth by Mateo Flow RN in Nov

## 2014-04-11 ENCOUNTER — Encounter: Payer: Self-pay | Admitting: Neurology

## 2014-04-11 ENCOUNTER — Ambulatory Visit (INDEPENDENT_AMBULATORY_CARE_PROVIDER_SITE_OTHER): Payer: Medicare Other | Admitting: Neurology

## 2014-04-11 VITALS — BP 107/66 | HR 66 | Temp 97.7°F | Ht 73.0 in | Wt 203.0 lb

## 2014-04-11 DIAGNOSIS — R2689 Other abnormalities of gait and mobility: Secondary | ICD-10-CM

## 2014-04-11 DIAGNOSIS — G252 Other specified forms of tremor: Secondary | ICD-10-CM

## 2014-04-11 DIAGNOSIS — R29818 Other symptoms and signs involving the nervous system: Secondary | ICD-10-CM

## 2014-04-11 DIAGNOSIS — G243 Spasmodic torticollis: Secondary | ICD-10-CM

## 2014-04-11 MED ORDER — TRIHEXYPHENIDYL HCL 2 MG PO TABS: 2.0000 mg | ORAL_TABLET | Freq: Three times a day (TID) | ORAL | Status: DC

## 2014-04-11 NOTE — Progress Notes (Signed)
Subjective:    Patient ID: Kevin Potter is a 80 y.o. male.  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 He is unaccompanied today. I first met him on 04/11/2013 at the request of his primary care physician. At the time I suggested he continue with Artane at the current dose.   Today he reports doing well overall, but balance is a little worse. He is has been on Eliquis. He has been tolerating it. He has been on Artane 2 mg 3 times a day. He says that his insurance did not cover it next year. I did fill out his insurance prescription form that he brought today. Alternatives offered on the form were Sinemet, amantadine, and dopamine agonists, all of which are better for parkinsonism and Parkinson's disease but not really helpful for dystonic tremor so I do believe it is justified for him to continue with Artane.   He was originally diagnosed with essential tremor several years ago by his internist. 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 He previously saw Dr. Morene Antu and was last seen by him in 2012, and on 03/06/2012 he was seen by our nurse practitioner Ms. Hassell Done. He was deemed stable at the time of his last appointment and was kept on the same medication. He has been taking Artane 2 mg 3 times a day which helps his hand and head tremor. He had some blood work in August of this year which I reviewed: His TSH was low at 0.04, free T4 was normal and total T3 was also in the normal range, PSA was mildly elevated at 4.811. He has been on Artane for several years. He  has not been on Mysoline and has been on propranolol for years. He has noted no problems with fine motor skills.   Several years ago, he had Botox injections into the neck, but he did not like it, as the injections were painful and he did not see any benefit.    His Past Medical History Is Significant For: Past Medical History  Diagnosis Date  . Atrial flutter   . Chronic anticoagulation   . Tremor   . Hypertension   . Sleep apnea   . Hypothyroidism   . Goiter   . Erectile dysfunction   . Hyperthyroidism   . BPH (benign prostatic hyperplasia)   . 1St degree AV block   . Gallstones   . Colonic adenoma   . Chicken pox   . Gilbert's syndrome   . Diverticulosis   . Colon polyps   . Hemorrhoids   . Hiatal hernia     His Past Surgical History Is Significant For: Past Surgical History  Procedure Laterality Date  . Cardioversion  03/15/2007    elective DC/x1 with joint pads in the anterior & posterior position/no complications  . Appendectomy    . Nasal septum surgery    . Tonsillectomy      with uvula removed  . Left ankle surgery    . Uva    . Palate / uvula biopsy / excision      His Family History Is Significant For: Family History  Problem Relation Age of Onset  . Cancer Mother     unknown type  . Cancer Sister     unknown type  . Heart disease Mother   . Heart disease Father   . Heart disease Brother     His Social History Is Significant For: History   Social History  . Marital Status: Married    Spouse Name: N/A    Number of Children: 2  . Years of Education: N/A   Occupational History  . financial planner    Social History Main Topics  . Smoking status: Never Smoker   . Smokeless tobacco: Never Used  . Alcohol Use: 0.0 oz/week    0 Not specified per week     Comment: red wine at night  . Drug Use: No  . Sexual Activity: Not Currently   Other Topics Concern  . None   Social History Narrative    His Allergies Are:  No Known  Allergies:  His Current Medications Are:  Outpatient Encounter Prescriptions as of 04/11/2014  Medication Sig  . apixaban (ELIQUIS) 5 MG TABS tablet Take 5 mg by mouth 2 (two) times daily.  . fluticasone (FLONASE) 50 MCG/ACT nasal spray Place 1 spray into the nose daily.   Marland Kitchen FLUZONE HIGH-DOSE 0.5 ML SUSY   . LORazepam (ATIVAN) 1 MG tablet Take 1 mg by mouth daily.    Marland Kitchen omeprazole (PRILOSEC) 40 MG capsule TAKE 1 CAPSULE BY MOUTH DAILY  . propranolol (INDERAL) 20 MG tablet TAKE 1 TABLET BY MOUTH THREE TIMES DAILY  . trihexyphenidyl (ARTANE) 2 MG tablet TAKE 1 TABLET BY MOUTH THREE TIMES DAILY WITH MEALS  . [DISCONTINUED] apixaban (ELIQUIS) 5 MG TABS tablet Take 1 tablet (5 mg total) by mouth 2 (two) times daily.  . [DISCONTINUED] FLUZONE HIGH-DOSE injection   . [DISCONTINUED] omeprazole (PRILOSEC) 40 MG capsule TAKE 1 CAPSULE (40 MG TOTAL) BY MOUTH DAILY.  :  Review of Systems:  Out of a complete 14 point review of systems, all are reviewed and negative with the exception of these symptoms as listed below:   Review of Systems  HENT:       Ringing in ears  Eyes:       Blurred vision  Musculoskeletal: Positive for neck stiffness.  Neurological: Positive for tremors.    Objective:  Neurologic Exam  Physical Exam Physical Examination:   Filed Vitals:   04/11/14 1135  BP: 107/66  Pulse: 66  Temp: 97.7 F (36.5 C)   General Examination: The patient is a very pleasant 80 y.o. male in no acute distress. He appears well-developed and well-nourished and well groomed.   HEENT: Normocephalic, atraumatic, pupils are equal, round and reactive to light and accommodation. Extraocular tracking is good without limitation to gaze excursion or nystagmus noted. Normal smooth pursuit is noted. Hearing is grossly intact. Face is symmetric with normal facial animation and normal facial sensation. Speech is clear with no dysarthria noted. There is no hypophonia. There is a mild, constant,  side-to-side head tremor. Neck is moderately rigid with decrease in active and passive range of mobility. He has evidence of anterocollis, left laterocollis and a slight right torticollis, unchanged. He has a dystonic head tremor, mostly side to side but irregular and worse with walking. He has no voice tremor. There are no carotid bruits on auscultation. Oropharynx exam reveals: mild mouth dryness, adequate dental hygiene and moderate airway crowding, due to larger tongue and narrow airway. He is s/p uvulectomy and TE. Mallampati  is class II. Tongue protrudes centrally and palate elevates symmetrically.   Chest: Clear to auscultation without wheezing, rhonchi or crackles noted.  Heart: S1+S2+0, regular and normal without murmurs, rubs or gallops noted.   Abdomen: Soft, non-tender and non-distended with normal bowel sounds appreciated on auscultation.  Extremities: There is trace pitting edema in the distal lower extremities bilaterally. Pedal pulses are intact.  Skin: Warm and dry without trophic changes noted. There are no varicose veins.  Musculoskeletal: exam reveals no obvious joint deformities, tenderness or joint swelling or erythema.   Neurologically:  Mental status: The patient is awake, alert and oriented in all 4 spheres. His memory, attention, language and knowledge are appropriate. There is no aphasia, agnosia, apraxia or anomia. Speech is clear with normal prosody and enunciation. Thought process is linear. Mood is congruent and affect is normal.  Cranial nerves are as described above under HEENT exam. In addition, shoulder shrug is normal with unequal shoulder height noted, with a left shoulder elevation.  Motor exam: Normal bulk, strength and tone is noted. There is no drift, or rebound. There is no resting tremor. There is no significant upper extremity postural or action tremor.  Reflexes are 2+ throughout. Fine motor skills are intact with normal finger taps, normal hand  movements, normal rapid alternating patting, normal foot taps and normal foot agility.  Cerebellar testing shows no dysmetria or intention tremor on finger to nose testing. Heel to shin is unremarkable bilaterally. There is no truncal or gait ataxia.  Sensory exam is intact to light touch, pinprick, vibration, temperature sense and proprioception in the upper and lower extremities.  Gait, station and balance: He stands with mild difficulty and pushes himself up. His stance is narrow based but he is more comfortable standing a little wide-based. He walks with decreased arm swing on the right. Romberg is negative.  Assessment and Plan:    In summary, Kevin Potter is a very pleasant 80 year old male with a history of head tremor. His physical exam is most consistent with cervical dystonia and dystonic head tremor. He appears to be stable. He does not have a family history of ET and he does not have much in the way of hand tremors and does not report fine motor difficulties. He does have a mild problem with his balance, which is likely a function of medication side effect, advancing age, as well as due to his underlying tremors. He is advised to use his cane for safety. As far as symptomatic treatment is concerned. He has tried Botox injections in the past but does not recall that they were helpful and actually did not feel good after. He has had some success with trihexyphenidyl and denies any major side effects. To that and, I discussed with him continuation of the same medical regimen. He was in agreement. Otherwise his physical exam is benign and mostly stable.  I answered all his questions today and the patient was in agreement with the above outlined plan. I would like to see the patient back in 6-12 months, sooner if the need arises and encouraged him to call with any interim questions, concerns, problems or updates and refill requests. I did refill his Artane prescription and he requested a 12 month  appointment.

## 2014-04-11 NOTE — Patient Instructions (Signed)
You look stable. We will keep the same medication. I will see you back in a year, sooner if needed.

## 2014-04-29 ENCOUNTER — Telehealth: Payer: Self-pay | Admitting: Internal Medicine

## 2014-04-30 NOTE — Telephone Encounter (Signed)
Routed to [facility] - pt of Amy Dillard's

## 2014-05-14 ENCOUNTER — Other Ambulatory Visit: Payer: Self-pay | Admitting: Neurology

## 2014-05-14 NOTE — Telephone Encounter (Signed)
Left message for patient to call back. He actually needs an office visit if he would like to discuss coming off medications. He has not been seen since 2013.

## 2014-05-15 ENCOUNTER — Telehealth: Payer: Self-pay | Admitting: Internal Medicine

## 2014-05-15 NOTE — Telephone Encounter (Signed)
Hasn't been seen > 2 yrs. Based on prior evaluation, stay on PPI

## 2014-05-15 NOTE — Telephone Encounter (Signed)
Pt states he has been taking prilosec for quite a while now. Pt states he would like to come off of the prilosec unless Dr. Henrene Pastor thinks he needs to continue taking it. Please advise.

## 2014-05-15 NOTE — Telephone Encounter (Signed)
Spoke with pt and he is aware. 

## 2014-05-16 ENCOUNTER — Telehealth: Payer: Self-pay | Admitting: Cardiology

## 2014-05-16 NOTE — Telephone Encounter (Signed)
Returned call to patient no answer.LMTC. 

## 2014-05-16 NOTE — Telephone Encounter (Signed)
New Message  Called to sched recall; pt wanted to speak with nurse to possibly start coming every 12 mos instead of 6. Please call back and discuss.

## 2014-05-19 NOTE — Telephone Encounter (Signed)
Returned call to patient follow up appointment scheduled with Dr.Jordan 06/24/14 at 9:30 am.

## 2014-05-22 NOTE — Telephone Encounter (Signed)
Patient states that he was already contacted and told to continue medications. I advised that he does need office visit and he states he will call back to set that up.

## 2014-05-31 ENCOUNTER — Other Ambulatory Visit: Payer: Self-pay | Admitting: Internal Medicine

## 2014-06-24 ENCOUNTER — Ambulatory Visit (INDEPENDENT_AMBULATORY_CARE_PROVIDER_SITE_OTHER): Payer: Medicare Other | Admitting: Cardiology

## 2014-06-24 ENCOUNTER — Encounter: Payer: Self-pay | Admitting: Cardiology

## 2014-06-24 VITALS — BP 122/78 | HR 89 | Ht 73.0 in | Wt 207.3 lb

## 2014-06-24 DIAGNOSIS — I10 Essential (primary) hypertension: Secondary | ICD-10-CM

## 2014-06-24 DIAGNOSIS — I483 Typical atrial flutter: Secondary | ICD-10-CM

## 2014-06-24 DIAGNOSIS — Z7901 Long term (current) use of anticoagulants: Secondary | ICD-10-CM

## 2014-06-24 NOTE — Progress Notes (Signed)
Kevin Potter Date of Birth: Apr 09, 1932 Medical Record #[id number]  History of Present Illness: Kevin Potter is seen for follow up of atrial flutter.  He has chronic atrial flutter. Intolerant to Rythmol and failed on past cardioversion back in 2008. In 2008 he did have a Myoview study which showed no ischemia and an ejection fraction of 47%. Echocardiogram at that time also showed an ejection fraction of 45-50%. He is managed with rate control and anticoagulation.He is now on Eliquis.  Repeat Echo in July 2014 showed normal LV function with EF 55-65%.  He denies chest pain or dizziness. He does have a large goiter that is being followed. He only notes dyspnea when he is walking up hills. Tries to walk daily. He does note some swelling in his ankles particularly when he is sitting on long trips.  Current Outpatient Prescriptions on File Prior to Visit  Medication Sig Dispense Refill  . apixaban (ELIQUIS) 5 MG TABS tablet Take 5 mg by mouth 2 (two) times daily.    . fluticasone (FLONASE) 50 MCG/ACT nasal spray Place 1 spray into the nose as needed.     Marland Kitchen FLUZONE HIGH-DOSE 0.5 ML SUSY     . LORazepam (ATIVAN) 1 MG tablet Take 1 mg by mouth daily.      Marland Kitchen omeprazole (PRILOSEC) 40 MG capsule TAKE 1 CAPSULE BY MOUTH DAILY 30 capsule 0  . propranolol (INDERAL) 20 MG tablet TAKE 1 TABLET BY MOUTH THREE TIMES DAILY 90 tablet 1  . trihexyphenidyl (ARTANE) 2 MG tablet Take 1 tablet (2 mg total) by mouth 3 (three) times daily with meals. 270 tablet 3   No current facility-administered medications on file prior to visit.    No Known Allergies  Past Medical History  Diagnosis Date  . Atrial flutter   . Chronic anticoagulation   . Tremor   . Hypertension   . Sleep apnea   . Hypothyroidism   . Goiter   . Erectile dysfunction   . Hyperthyroidism   . BPH (benign prostatic hyperplasia)   . 1St degree AV block   . Gallstones   . Colonic adenoma   . Chicken pox   . Gilbert's syndrome   .  Diverticulosis   . Colon polyps   . Hemorrhoids   . Hiatal hernia     Past Surgical History  Procedure Laterality Date  . Cardioversion  03/15/2007    elective DC/x1 with joint pads in the anterior & posterior position/no complications  . Appendectomy    . Nasal septum surgery    . Tonsillectomy      with uvula removed  . Left ankle surgery    . Uva    . Palate / uvula biopsy / excision      History  Smoking status  . Never Smoker   Smokeless tobacco  . Never Used    History  Alcohol Use  . 0.0 oz/week  . 0 Not specified per week    Comment: red wine at night    Family History  Problem Relation Age of Onset  . Cancer Mother     unknown type  . Cancer Sister     unknown type  . Heart disease Mother   . Heart disease Father   . Heart disease Brother     Review of Systems: The review of systems is per the HPI.  All other systems were reviewed and are negative.  Physical Exam: BP 122/78 mmHg  Pulse 89  Ht  6\' 1"  (1.854 m)  Wt 207 lb 4.8 oz (94.031 kg)  BMI 27.36 kg/m2 Patient is very pleasant and in no acute distress. He has a mild generalized tremor. Skin is warm and dry. Color is normal.  HEENT is unremarkable. Normocephalic/atraumatic. PERRL. Sclera are nonicteric. Neck is supple. Large goiter. No JVD. Lungs are clear. Cardiac exam shows an irregular rhythm. His rate is controlled.  Abdomen is soft. Extremities are without edema. Gait and ROM are intact. No gross neurologic deficits noted.  LABORATORY DATA:  Complete labs reviewed from Dr. Joylene Draft in May 2015. Scanned into record.   Assessment & Plan:  1. Chronic atrial flutter - he is managed with rate control and anticoagulation. Continue Eliquis 5 mg BID  2. HTN - blood pressure looks good. No change in his current regimen.   3. Intermittent edema. Recommend restriction of sodium intake and wearing compression socks when he travels.  I will follow up in 6 months.

## 2014-06-24 NOTE — Patient Instructions (Signed)
Continue your current therapy  Wear support hose and limit your salt intake.  I will see you in 6 months.

## 2014-07-08 ENCOUNTER — Encounter: Payer: Self-pay | Admitting: Internal Medicine

## 2014-07-08 ENCOUNTER — Ambulatory Visit (INDEPENDENT_AMBULATORY_CARE_PROVIDER_SITE_OTHER): Payer: Medicare Other | Admitting: Internal Medicine

## 2014-07-08 VITALS — BP 112/70 | HR 88 | Ht 72.0 in | Wt 206.2 lb

## 2014-07-08 DIAGNOSIS — Z86010 Personal history of colonic polyps: Secondary | ICD-10-CM

## 2014-07-08 DIAGNOSIS — K222 Esophageal obstruction: Secondary | ICD-10-CM

## 2014-07-08 DIAGNOSIS — K219 Gastro-esophageal reflux disease without esophagitis: Secondary | ICD-10-CM

## 2014-07-08 MED ORDER — OMEPRAZOLE 40 MG PO CPDR: 40.0000 mg | DELAYED_RELEASE_CAPSULE | Freq: Every day | ORAL | Status: DC

## 2014-07-08 NOTE — Progress Notes (Signed)
HISTORY OF PRESENT ILLNESS:  Kevin Potter is a 81 y.o. male with MULTIPLE significant medical problems as listed below. He was last seen in this office 02/29/2012 regarding episodic infrequent spells and difficulty breathing. This was not felt to be GI in origin. He reports no problems since. He presents today for ongoing management of his chronic GERD and requests dictation refill. He last underwent colonoscopy and upper endoscopy in April 2013 to evaluate heme positive stool. Colonoscopy revealed diminutive adenoma and severe left-sided diverticulosis with internal hemorrhoids. Upper endoscopy revealed incidental distal esophageal stricture but was otherwise normal. Patient reports that he has been taking omeprazole 40 mg daily. On medication he has no reflux symptoms. He denies dysphagia. GI review of systems is also negative.  REVIEW OF SYSTEMS:  All non-GI ROS negative except for hearing problems, cough, voice change, ankle swelling  Past Medical History  Diagnosis Date  . Atrial flutter   . Chronic anticoagulation   . Tremor   . Hypertension   . Sleep apnea   . Hypothyroidism   . Goiter   . Erectile dysfunction   . Hyperthyroidism   . BPH (benign prostatic hyperplasia)   . 1St degree AV block   . Gallstones   . Colonic adenoma   . Chicken pox   . Gilbert's syndrome   . Diverticulosis   . Colon polyps   . Hemorrhoids   . Hiatal hernia     Past Surgical History  Procedure Laterality Date  . Cardioversion  03/15/2007    elective DC/x1 with joint pads in the anterior & posterior position/no complications  . Appendectomy    . Nasal septum surgery    . Tonsillectomy      with uvula removed  . Left ankle surgery    . Uva    . Palate / uvula biopsy / excision      Social History Kevin Potter  reports that he has never smoked. He has never used smokeless tobacco. He reports that he drinks alcohol. He reports that he does not use illicit drugs.  family history includes  Cancer in his mother and sister; Heart disease in his brother, father, and mother.  No Known Allergies     PHYSICAL EXAMINATION: Vital signs: BP 112/70 mmHg  Pulse 88  Ht 6' (1.829 m)  Wt 206 lb 4 oz (93.554 kg)  BMI 27.97 kg/m2 General: Well-developed, well-nourished, no acute distress HEENT: Sclerae are anicteric, conjunctiva pink. Oral mucosa intact Lungs: Clear Heart: Regular Abdomen: soft, nontender, nondistended, no obvious ascites, no peritoneal signs, normal bowel sounds. No organomegaly. Extremities: No edema Psychiatric: alert and oriented x3. Cooperative   ASSESSMENT:  #1. Chronic GERD. Asymptomatic on PPI #2. Asymptomatic esophageal stricture #3. History of adenomatous colon polyps. Last colonoscopy 2013. Aged out of surveillance #4. Multiple medical problems. Stable   PLAN:  #1. Reflux precautions #2. Refill omeprazole 40 mg daily #3. Routine GI follow-up in 2 years. Sooner if needed.

## 2014-07-08 NOTE — Patient Instructions (Signed)
We have sent the following medications to your pharmacy for you to pick up at your convenience: Omeprazole  Please follow up with Dr. Perry in 2 years. 

## 2014-07-10 ENCOUNTER — Other Ambulatory Visit: Payer: Self-pay | Admitting: Neurology

## 2014-10-07 ENCOUNTER — Telehealth: Payer: Self-pay

## 2014-10-07 MED ORDER — OMEPRAZOLE 40 MG PO CPDR: 40.0000 mg | DELAYED_RELEASE_CAPSULE | Freq: Every day | ORAL | Status: DC

## 2014-10-07 NOTE — Telephone Encounter (Signed)
Sent omeprazole refill to [facility]

## 2014-12-01 ENCOUNTER — Other Ambulatory Visit: Payer: Self-pay

## 2014-12-23 ENCOUNTER — Encounter: Payer: Self-pay | Admitting: Internal Medicine

## 2014-12-26 ENCOUNTER — Other Ambulatory Visit: Payer: Self-pay | Admitting: Cardiology

## 2015-01-06 ENCOUNTER — Other Ambulatory Visit: Payer: Self-pay | Admitting: Neurology

## 2015-01-06 NOTE — Telephone Encounter (Signed)
Patient has appt scheduled

## 2015-01-15 ENCOUNTER — Ambulatory Visit (INDEPENDENT_AMBULATORY_CARE_PROVIDER_SITE_OTHER): Payer: Medicare Other | Admitting: Cardiology

## 2015-01-15 ENCOUNTER — Encounter: Payer: Self-pay | Admitting: Cardiology

## 2015-01-15 VITALS — BP 116/64 | HR 90 | Ht 73.0 in | Wt 204.9 lb

## 2015-01-15 DIAGNOSIS — I483 Typical atrial flutter: Secondary | ICD-10-CM

## 2015-01-15 DIAGNOSIS — Z7901 Long term (current) use of anticoagulants: Secondary | ICD-10-CM | POA: Diagnosis not present

## 2015-01-15 DIAGNOSIS — R0600 Dyspnea, unspecified: Secondary | ICD-10-CM | POA: Diagnosis not present

## 2015-01-15 NOTE — Patient Instructions (Signed)
Continue your current therapy  I will see you in 6 months.   

## 2015-01-15 NOTE — Progress Notes (Signed)
Julien Girt Date of Birth: [birth year] Medical Record #[id number]  History of Present Illness: Mr. Kevin Potter is seen for follow up of atrial flutter.  He has chronic atrial flutter. Intolerant to Rythmol and failed on past cardioversion back in 2008. In 2008 he did have a Myoview study which showed no ischemia and an ejection fraction of 47%. Echocardiogram at that time also showed an ejection fraction of 45-50%. He is managed with rate control and anticoagulation.He is on Eliquis.  Repeat Echo in July 2014 showed normal LV function with EF 55-65%.  On follow up today he denies chest pain or dizziness. He does have a large goiter that is being followed. He complains of  dyspnea when he is walking up hills and he does walk 40 minutes daily. Weight has been stable.  Current Outpatient Prescriptions on File Prior to Visit  Medication Sig Dispense Refill  . apixaban (ELIQUIS) 5 MG TABS tablet Take 5 mg by mouth 2 (two) times daily.    Marland Kitchen ELIQUIS 5 MG TABS tablet TAKE 1 TABLET BY MOUTH TWICE DAILY 60 tablet 0  . fluticasone (FLONASE) 50 MCG/ACT nasal spray Place 1 spray into the nose as needed.     Marland Kitchen FLUZONE HIGH-DOSE 0.5 ML SUSY     . LORazepam (ATIVAN) 1 MG tablet Take 1 mg by mouth daily.      Marland Kitchen omeprazole (PRILOSEC) 40 MG capsule Take 1 capsule (40 mg total) by mouth daily. 90 capsule 3  . propranolol (INDERAL) 20 MG tablet TAKE 1 TABLET BY MOUTH THREE TIMES DAILY 270 tablet 3  . trihexyphenidyl (ARTANE) 2 MG tablet TAKE 1 TABLET (2 MG TOTAL) BY MOUTH 3 (THREE) TIMES DAILY WITH MEALS. 270 tablet 0   No current facility-administered medications on file prior to visit.    No Known Allergies  Past Medical History  Diagnosis Date  . Atrial flutter   . Chronic anticoagulation   . Tremor   . Hypertension   . Sleep apnea   . Hypothyroidism   . Goiter   . Erectile dysfunction   . Hyperthyroidism   . BPH (benign prostatic hyperplasia)   . 1St degree AV block   . Gallstones   . Colonic  adenoma   . Chicken pox   . Gilbert's syndrome   . Diverticulosis   . Colon polyps   . Hemorrhoids   . Hiatal hernia     Past Surgical History  Procedure Laterality Date  . Cardioversion  03/15/2007    elective DC/x1 with joint pads in the anterior & posterior position/no complications  . Appendectomy    . Nasal septum surgery    . Tonsillectomy      with uvula removed  . Left ankle surgery    . Uva    . Palate / uvula biopsy / excision      History  Smoking status  . Never Smoker   Smokeless tobacco  . Never Used    History  Alcohol Use  . 0.0 oz/week  . 0 Standard drinks or equivalent per week    Comment: red wine at night    Family History  Problem Relation Age of Onset  . Cancer Mother     unknown type  . Cancer Sister     unknown type  . Heart disease Mother   . Heart disease Father   . Heart disease Brother     Review of Systems: The review of systems is per the HPI.  All other  systems were reviewed and are negative.  Physical Exam: BP 116/64 mmHg  Pulse 90  Ht 6\' 1"  (1.854 m)  Wt 92.942 kg (204 lb 14.4 oz)  BMI 27.04 kg/m2 Patient is very pleasant and in no acute distress. He has a mild generalized tremor. Skin is warm and dry. Color is normal.  HEENT is unremarkable. Normocephalic/atraumatic. PERRL. Sclera are nonicteric. Neck is supple. Large goiter. No JVD. Lungs are clear. Cardiac exam shows an irregular rhythm. His rate is controlled.  Abdomen is soft. Extremities reveal 1+ right ankle edema.  Gait and ROM are intact. No gross neurologic deficits noted.  LABORATORY DATA:  Ecg today shows atrial flutter with rate 86 bpm. Nonspecific IVCD. No change from prior study. I have personally reviewed and interpreted this study.    Assessment & Plan:  1. Chronic atrial flutter - he is managed with rate control and anticoagulation. Continue Eliquis 5 mg BID. He does have some dyspnea on exertion with chronic class 2 symptoms.   2. HTN - blood  pressure is well controlled. No change in his current regimen.   3. Intermittent edema. Recommend restriction of sodium intake and wearing compression socks when needed.  I will follow up in 6 months.

## 2015-01-23 ENCOUNTER — Other Ambulatory Visit: Payer: Self-pay | Admitting: *Deleted

## 2015-01-23 MED ORDER — APIXABAN 5 MG PO TABS: 5.0000 mg | ORAL_TABLET | Freq: Two times a day (BID) | ORAL | Status: DC

## 2015-02-01 ENCOUNTER — Other Ambulatory Visit: Payer: Self-pay | Admitting: Neurology

## 2015-03-11 ENCOUNTER — Other Ambulatory Visit: Payer: Self-pay

## 2015-03-11 MED ORDER — TRIHEXYPHENIDYL HCL 2 MG PO TABS: ORAL_TABLET | ORAL | Status: DC

## 2015-03-26 ENCOUNTER — Other Ambulatory Visit: Payer: Self-pay | Admitting: Neurology

## 2015-04-13 ENCOUNTER — Ambulatory Visit (INDEPENDENT_AMBULATORY_CARE_PROVIDER_SITE_OTHER): Payer: Medicare Other | Admitting: Neurology

## 2015-04-13 ENCOUNTER — Encounter: Payer: Self-pay | Admitting: Neurology

## 2015-04-13 VITALS — BP 138/72 | HR 72 | Resp 16 | Ht 73.0 in | Wt 203.0 lb

## 2015-04-13 DIAGNOSIS — G243 Spasmodic torticollis: Secondary | ICD-10-CM | POA: Diagnosis not present

## 2015-04-13 DIAGNOSIS — R2689 Other abnormalities of gait and mobility: Secondary | ICD-10-CM

## 2015-04-13 DIAGNOSIS — R29818 Other symptoms and signs involving the nervous system: Secondary | ICD-10-CM | POA: Diagnosis not present

## 2015-04-13 DIAGNOSIS — G252 Other specified forms of tremor: Secondary | ICD-10-CM

## 2015-04-13 MED ORDER — TRIHEXYPHENIDYL HCL 2 MG PO TABS: ORAL_TABLET | ORAL | Status: DC

## 2015-04-13 NOTE — Patient Instructions (Signed)
  Please remember, that any kind of tremor may be exacerbated by anxiety, anger, nervousness, excitement, dehydration, sleep deprivation, by caffeine, and low blood sugar values or blood sugar fluctuations. Some medications, especially some antidepressants and lithium can cause or exacerbate tremors. Tremors may temporarily calm down her subside with the use of a benzodiazepine such as Valium or related medications and with alcohol. Be aware however that drinking alcohol is not an approved treatment or appropriate treatment for tremor control and long-term use of benzodiazepines such as Valium, lorazepam, alprazolam, or clonazepam can cause habit formation, physical and psychological addiction.  We will keep your medication, Artane, the same.   FU in 1 year.

## 2015-04-13 NOTE — Progress Notes (Signed)
Subjective:    Patient ID: Kevin Potter is a 81 y.o. male.  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 He is unaccompanied today. I last saw him on 04/11/2014, at which time he reported doing well overall, but balance was a little worse. He was on Eliquis per cardiology. He was on Artane 2 mg 3 times a day. I kept him on the same dose of Artane. He was advised to start using a cane for safety.  Today, 04/13/15: He reports doing well, no new changes from the tremor standpoint and feels stable. He has had some balance issues, had 2 falls several months. He stepped off a curb and fell on his back and one time he fell on uneven ground, no serious injuries, thankfully. He had lab work with his primary care physician in May of this year. He is due for another lab check including thyroid function, BMP right about now with Dr. Joylene Draft. We will request lab test results from May 2016. He walks about 40 minutes each day. He has started using a cane at times.  Previously:   I first met him on 04/11/2013 at the request of his primary care physician. At the time I suggested he continue with Artane at the current dose.   He was originally diagnosed with essential tremor several years ago by his internist. 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 He previously saw Dr. Morene Antu and was last seen by him in 2012, and on 03/06/2012 he was seen by our nurse practitioner Ms. Hassell Done. He was deemed stable at the time of his last appointment and was kept on the same medication. He has been  taking Artane 2 mg 3 times a day which helps his hand and head tremor. He had some blood work in August of this year which I reviewed: His TSH was low at 0.04, free T4 was normal and total T3 was also in the normal range, PSA was mildly elevated at 4.811. He has been on Artane for several years. He has not been on Mysoline and has been on propranolol for years. He has noted no problems with fine motor skills.   Several years ago, he had Botox injections into the neck, but he did not like it, as the injections were painful and he did not see any benefit.   His Past Medical History Is Significant For: Past Medical History  Diagnosis Date  . Atrial flutter ([place])   . Chronic anticoagulation   . Tremor   . Hypertension   . Sleep apnea   . Hypothyroidism   . Goiter   . Erectile dysfunction   . Hyperthyroidism   . BPH (benign prostatic hyperplasia)   . 1St degree AV block   . Gallstones   . Colonic adenoma   . Chicken pox   . Gilbert's syndrome   . Diverticulosis   . Colon polyps   . Hemorrhoids   . Hiatal hernia     His Past Surgical History Is Significant For: Past Surgical History  Procedure Laterality Date  . Cardioversion  03/15/2007    elective DC/x1 with joint  pads in the anterior & posterior position/no complications  . Appendectomy    . Nasal septum surgery    . Tonsillectomy      with uvula removed  . Left ankle surgery    . Uva    . Palate / uvula biopsy / excision      His Family History Is Significant For: Family History  Problem Relation Age of Onset  . Cancer Mother     unknown type  . Cancer Sister     unknown type  . Heart disease Mother   . Heart disease Father   . Heart disease Brother     His Social History Is Significant For: Social History   Social History  . Marital Status: Married    Spouse Name: N/A  . Number of Children: 2  . Years of Education: N/A   Occupational History  . financial planner    Social History Main Topics  . Smoking  status: Never Smoker   . Smokeless tobacco: Never Used  . Alcohol Use: 0.0 oz/week    0 Standard drinks or equivalent per week     Comment: red wine at night  . Drug Use: No  . Sexual Activity: Not Currently   Other Topics Concern  . None   Social History Narrative    His Allergies Are:  No Known Allergies:   His Current Medications Are:  Outpatient Encounter Prescriptions as of 04/13/2015  Medication Sig  . apixaban (ELIQUIS) 5 MG TABS tablet Take 1 tablet (5 mg total) by mouth 2 (two) times daily.  . fluticasone (FLONASE) 50 MCG/ACT nasal spray Place 1 spray into the nose as needed.   Marland Kitchen FLUZONE HIGH-DOSE 0.5 ML SUSY   . LORazepam (ATIVAN) 1 MG tablet Take 1 mg by mouth daily.    Marland Kitchen omeprazole (PRILOSEC) 40 MG capsule Take 1 capsule (40 mg total) by mouth daily.  . propranolol (INDERAL) 20 MG tablet TAKE 1 TABLET BY MOUTH THREE TIMES DAILY  . trihexyphenidyl (ARTANE) 2 MG tablet TAKE 1 TABLET (2 MG TOTAL) BY MOUTH 3 (THREE) TIMES DAILY WITH MEALS.  . [DISCONTINUED] trihexyphenidyl (ARTANE) 2 MG tablet TAKE 1 TABLET (2 MG TOTAL) BY MOUTH 3 (THREE) TIMES DAILY WITH MEALS.   No facility-administered encounter medications on file as of 04/13/2015.  :  Review of Systems:  Out of a complete 14 point review of systems, all are reviewed and negative with the exception of these symptoms as listed below:   Review of Systems  Neurological:       No new changes or concerns.     Objective:  Neurologic Exam  Physical Exam Physical Examination:   Filed Vitals:   04/13/15 1116  BP: 138/72  Pulse: 72  Resp: 16   General Examination: The patient is a very pleasant 81 y.o. male in no acute distress. He appears well-developed and well-nourished and well groomed. He is in good spirits today.  HEENT: Normocephalic, atraumatic, pupils are equal, round and reactive to light and accommodation. Extraocular tracking is good without limitation to gaze excursion or nystagmus noted. Normal  smooth pursuit is noted. Hearing is grossly intact. Face is symmetric with normal facial animation and normal facial sensation. Speech is clear with no dysarthria noted. There is no hypophonia. There is a mild, constant, side-to-side head tremor. Neck is moderately rigid with decrease in active and passive range of mobility. He has evidence of anterocollis, left laterocollis and a slight right torticollis, unchanged. He has a dystonic  head tremor, mostly side to side but irregular and worse with walking. He has no voice tremor. There are no carotid bruits on auscultation. Oropharynx exam reveals: mild mouth dryness, adequate dental hygiene and moderate airway crowding, due to larger tongue and narrow airway. He is s/p uvulectomy and TE. Mallampati is class II. Tongue protrudes centrally and palate elevates symmetrically.   Chest: Clear to auscultation without wheezing, rhonchi or crackles noted.  Heart: S1+S2+0, regular and normal without murmurs, rubs or gallops noted.   Abdomen: Soft, non-tender and non-distended with normal bowel sounds appreciated on auscultation.  Extremities: There is trace to 1+ pitting edema in the distal lower extremities bilaterally. Pedal pulses are intact.  Skin: Warm and dry without trophic changes noted. There are no varicose veins.  Musculoskeletal: exam reveals no obvious joint deformities, tenderness or joint swelling or erythema.   Neurologically:  Mental status: The patient is awake, alert and oriented in all 4 spheres. His memory, attention, language and knowledge are appropriate. There is no aphasia, agnosia, apraxia or anomia. Speech is clear with normal prosody and enunciation. Thought process is linear. Mood is congruent and affect is normal.  Cranial nerves are as described above under HEENT exam. In addition, shoulder shrug is normal with unequal shoulder height noted, with a left shoulder elevation.  Motor exam: Normal bulk, strength and tone is noted.  There is no drift, or rebound. There is no resting tremor. There is no significant upper extremity postural or action tremor.  Reflexes are 1-2+ throughout. Fine motor skills are intact with normal finger taps, normal hand movements, normal rapid alternating patting, normal foot taps and normal foot agility.  Cerebellar testing shows no dysmetria or intention tremor on finger to nose testing. Sensory exam is intact to light touch in the upper and lower extremities.  Gait, station and balance: He stands with mild difficulty and pushes himself up. His stance is slightly wide-based. Romberg is negative. He walks slightly insecurely. He has some difficulty with turns.   Assessment and Plan:    In summary, Kevin Potter is a very pleasant 81 year old male with an underlying medical history of heart disease, obstructive sleep apnea not on CPAP, hypothyroidism, hypertension, erectile dysfunction, first-degree AV block, gallstones, hiatal hernia, atrial flutter diagnosed in 2007, BPH, allergic rhinitis, low back pain, and osteoarthritis, who presents for follow-up consultation of his head tremor. His physical exam is fairly stable. He reports more balance issues. His history and exam are most consistent with cervical dystonia and dystonic head tremor. He does not have a family history of ET and he does not have much in the way of hand tremors and does not report fine motor difficulties. He does have a mild problem with his balance, which is likely a function of medication side effect, advancing age, as well as due to his underlying tremors. He is advised to use his cane for safety. As far as symptomatic treatment is concerned. He has tried Botox injections in the past but does not recall that they were helpful and actually did not feel good after. He has had some success with trihexyphenidyl and denies any major side effects. To that and, I discussed with him continuation of the same medical regimen. He was in  agreement. He has routine blood work usually on a 6 monthly basis through his primary care physician. We will request test results from May of this year and he is due for a recheck this month. I would like to see  him back in 6 months, sooner if needed.  I answered all his questions today and the patient was in agreement. I renewed his prescription for Artane 2 mg 3 times a day today, 90 days supply with refills.  I spent 20 minutes in total face-to-face time with the patient, more than 50% of which was spent in counseling and coordination of care, reviewing test results, reviewing medication and discussing or reviewing the diagnosis of tremor, its prognosis and treatment options.

## 2015-04-29 ENCOUNTER — Other Ambulatory Visit: Payer: Self-pay | Admitting: Cardiology

## 2015-04-29 MED ORDER — APIXABAN 5 MG PO TABS: 5.0000 mg | ORAL_TABLET | Freq: Two times a day (BID) | ORAL | Status: DC

## 2015-05-05 ENCOUNTER — Telehealth: Payer: Self-pay | Admitting: *Deleted

## 2015-05-05 NOTE — Telephone Encounter (Signed)
Returned call to patient.Dr.Jordan advised ok to switch.Advised need to contact insurance to see what alternative that is preferred.Stated he will call his insurance and call me back.

## 2015-05-05 NOTE — Telephone Encounter (Signed)
It is OK by me to switch. Will need to contact insurance to see if there is an alternative that is preferred and what is the cost. I don't know if he is just getting in the donut hole now. If there is a cheaper alternative will get pharmacy to transition.  Adem Costlow [place] MD, [facility]

## 2015-05-05 NOTE — Telephone Encounter (Signed)
Pt wants to change from Eliquis to a more afforable medication, please call.

## 2015-05-05 NOTE — Telephone Encounter (Signed)
Returned call to patient.He stated eliquis is too expensive.Stated he use to take pradaxa before changed to eliquis.Dr.Jordan out of office.I will send message to him for advice.

## 2015-05-08 ENCOUNTER — Telehealth: Payer: Self-pay | Admitting: Cardiology

## 2015-05-08 NOTE — Telephone Encounter (Signed)
Patient calling the office for samples of medication:   1.  What medication and dosage are you requesting samples for? Eliquis- 5 mg   2.  Are you currently out of this medication? almost

## 2015-05-08 NOTE — Telephone Encounter (Signed)
Per phone note 05/05/15, patient was going to switch to something cheaper.

## 2015-05-11 NOTE — Telephone Encounter (Signed)
Returned call to patient no answer.LMTC. 

## 2015-05-11 NOTE — Telephone Encounter (Signed)
Returned call to patient Eliquis 5 mg samples left at [facility] office front desk. 

## 2015-06-08 ENCOUNTER — Other Ambulatory Visit: Payer: Self-pay | Admitting: Internal Medicine

## 2015-07-01 ENCOUNTER — Other Ambulatory Visit: Payer: Self-pay | Admitting: Neurology

## 2015-07-29 ENCOUNTER — Other Ambulatory Visit: Payer: Self-pay | Admitting: Pharmacist Clinician (PhC)/ Clinical Pharmacy Specialist

## 2015-07-29 MED ORDER — APIXABAN 5 MG PO TABS: 5.0000 mg | ORAL_TABLET | Freq: Two times a day (BID) | ORAL | Status: DC

## 2015-08-12 ENCOUNTER — Other Ambulatory Visit: Payer: Self-pay | Admitting: Neurology

## 2015-09-16 ENCOUNTER — Ambulatory Visit (HOSPITAL_COMMUNITY)
Admission: RE | Admit: 2015-09-16 | Discharge: 2015-09-16 | Disposition: A | Discharge status: Home / Self Care | Payer: Medicare Other | Source: Ambulatory Visit | Attending: Vascular Surgery | Admitting: Vascular Surgery

## 2015-09-16 ENCOUNTER — Other Ambulatory Visit (HOSPITAL_COMMUNITY): Payer: Self-pay | Admitting: Family Medicine

## 2015-09-16 DIAGNOSIS — I10 Essential (primary) hypertension: Secondary | ICD-10-CM | POA: Diagnosis not present

## 2015-09-16 DIAGNOSIS — M79604 Pain in right leg: Secondary | ICD-10-CM | POA: Diagnosis not present

## 2015-10-05 ENCOUNTER — Other Ambulatory Visit: Payer: Self-pay | Admitting: Pharmacist

## 2015-10-05 MED ORDER — APIXABAN 5 MG PO TABS: 5.0000 mg | ORAL_TABLET | Freq: Two times a day (BID) | ORAL | Status: DC

## 2015-12-20 ENCOUNTER — Other Ambulatory Visit: Payer: Self-pay | Admitting: Neurology

## 2016-02-02 ENCOUNTER — Other Ambulatory Visit: Payer: Self-pay | Admitting: Internal Medicine

## 2016-02-24 ENCOUNTER — Other Ambulatory Visit: Payer: Self-pay | Admitting: Internal Medicine

## 2016-02-24 DIAGNOSIS — M545 Low back pain: Principal | ICD-10-CM

## 2016-02-24 DIAGNOSIS — G8929 Other chronic pain: Secondary | ICD-10-CM

## 2016-03-14 ENCOUNTER — Encounter: Payer: Self-pay | Admitting: Neurology

## 2016-04-04 ENCOUNTER — Other Ambulatory Visit: Payer: Self-pay | Admitting: Cardiology

## 2016-04-04 NOTE — Telephone Encounter (Signed)
Rx request sent to pharmacy.  

## 2016-04-06 ENCOUNTER — Telehealth: Payer: Self-pay | Admitting: Internal Medicine

## 2016-04-06 NOTE — Telephone Encounter (Signed)
Pt states he wants to get off of PPI med. He has read about the side effects and risks and doesn't want to take them anymore. He would like to know how to come off of them and what an alternative would be for him. Please advise.

## 2016-04-07 NOTE — Telephone Encounter (Signed)
Patient notified He will come in on 04/12/16 10:45 to discuss

## 2016-04-07 NOTE — Telephone Encounter (Signed)
Make an office appointment. I have not seen him in almost 2 years

## 2016-04-12 ENCOUNTER — Encounter: Payer: Self-pay | Admitting: Internal Medicine

## 2016-04-12 ENCOUNTER — Ambulatory Visit: Payer: Medicare Other | Admitting: Neurology

## 2016-04-12 ENCOUNTER — Ambulatory Visit (INDEPENDENT_AMBULATORY_CARE_PROVIDER_SITE_OTHER): Payer: Medicare Other | Admitting: Internal Medicine

## 2016-04-12 VITALS — BP 116/70 | HR 72 | Ht 72.0 in | Wt 199.4 lb

## 2016-04-12 DIAGNOSIS — K222 Esophageal obstruction: Secondary | ICD-10-CM

## 2016-04-12 DIAGNOSIS — Z86010 Personal history of colonic polyps: Secondary | ICD-10-CM | POA: Diagnosis not present

## 2016-04-12 DIAGNOSIS — K219 Gastro-esophageal reflux disease without esophagitis: Secondary | ICD-10-CM

## 2016-04-12 NOTE — Progress Notes (Signed)
HISTORY OF PRESENT ILLNESS:  Kevin Potter is a 82 y.o. male with MULTIPLE significant medical problems as listed below. Patient presents today with a chief complaint of chronic GERD and questioning the need for PPI therapy. Was to discuss potential safety concerns of long-term use. He brings with him multiple articles from [facility] and elsewhere for my review. He also has multiple non-GI complaints as listed below in the review of systems non-GI. N/A event, he is known to have GERD, complicated by peptic stricture fortunately has been on PPI. Last upper endoscopy April 2013 demonstrated stricture but was otherwise normal. On medication he has had no swallowing troubles or " choking spells". Off medication he has some reflux symptoms and some dysphagia. His last colonoscopy at the same time demonstrated diverticulosis and diminutive polyps. He has aged out of surveillance given his age and comorbidities.  REVIEW OF SYSTEMS:  All non-GI ROS negative except for weakness walking up a hill, voice change, excessive urination with urinary leakage, back pain, sinus allergy trouble, ankle swelling  Past Medical History:  Diagnosis Date  . 1st degree AV block   . Atrial flutter ([facility])   . BPH (benign prostatic hyperplasia)   . Chicken pox   . Chronic anticoagulation   . Colon polyps   . Colonic adenoma   . Diverticulosis   . Erectile dysfunction   . Gallstones   . Gilbert's syndrome   . Goiter   . Hemorrhoids   . Hiatal hernia   . Hypertension   . Hyperthyroidism   . Hypothyroidism   . Sleep apnea   . Tremor     Past Surgical History:  Procedure Laterality Date  . APPENDECTOMY    . CARDIOVERSION  03/15/2007   elective DC/x1 with joint pads in the anterior & posterior position/no complications  . left ankle surgery    . NASAL SEPTUM SURGERY    . PALATE / UVULA BIOPSY / EXCISION    . TONSILLECTOMY     with uvula removed  . UVA      Social History Sanjith D Rumple  reports that  he has never smoked. He has never used smokeless tobacco. He reports that he drinks alcohol. He reports that he does not use drugs.  family history includes Cancer in his mother and sister; Heart disease in his brother, father, and mother.  No Known Allergies     PHYSICAL EXAMINATION:  Vital signs: BP 116/70 (BP Location: Left Arm, Patient Position: Sitting, Cuff Size: Normal)   Pulse 72 Comment: irregular  Ht 6' (1.829 m)   Wt 199 lb 6 oz (90.4 kg)   BMI 27.04 kg/m  General: Well-developed, well-nourished, no acute distress HEENT: Sclerae are anicteric, conjunctiva pink. Oral mucosa intact Lungs: Clear Heart: Regular Abdomen: soft, nontender, nondistended, no obvious ascites, no peritoneal signs, normal bowel sounds. No organomegaly. Extremities: No Cyanosis or edema. Has compression stockings on Psychiatric: alert and oriented x3. Cooperative   ASSESSMENT:  #1. GERD complicated by peptic stricture. Asymptomatic on PPI #2. Chronic PPI use. Question safety concerns #3. Multiple non-GI complaints that he discussed as listed above  PLAN:  #1. I reviewed with him the myriad of potential safety concerns of chronic PPI use. We also reviewed the recent JAMA letter from September. I provided him a copy. Told him that he is welcome to stop PPI to see if there any negative effects. I did discuss the benefit with regards to his esophageal mucosa and stricture. We also discussed   alternative acid suppressive medication such as H2 receptor antagonist therapy which should be taken twice daily. He understood. #2. See PCP regarding multiple non-GI complaints  25 minutes was spent face-to-face with this patient. Near the entire time use for counseling regarding GERD and discussing PPIs

## 2016-04-12 NOTE — Patient Instructions (Signed)
Please follow up as needed 

## 2016-06-01 ENCOUNTER — Ambulatory Visit (INDEPENDENT_AMBULATORY_CARE_PROVIDER_SITE_OTHER): Payer: Medicare Other | Admitting: Neurology

## 2016-06-01 ENCOUNTER — Encounter: Payer: Self-pay | Admitting: Neurology

## 2016-06-01 VITALS — BP 135/82 | HR 68 | Resp 20 | Ht 73.0 in | Wt 197.0 lb

## 2016-06-01 DIAGNOSIS — G252 Other specified forms of tremor: Secondary | ICD-10-CM

## 2016-06-01 DIAGNOSIS — R2689 Other abnormalities of gait and mobility: Secondary | ICD-10-CM

## 2016-06-01 DIAGNOSIS — R251 Tremor, unspecified: Secondary | ICD-10-CM | POA: Diagnosis not present

## 2016-06-01 MED ORDER — TRIHEXYPHENIDYL HCL 2 MG PO TABS: 2.0000 mg | ORAL_TABLET | Freq: Three times a day (TID) | ORAL | 3 refills | Status: DC

## 2016-06-01 NOTE — Progress Notes (Signed)
Subjective:    Patient ID: Kevin Potter is a 82 y.o. male.  HPI     Interim history:  Kevin Potter is a very pleasant 82 year old right-handed gentleman with an underlying medical history of heart disease, obstructive sleep apnea not on CPAP, hypothyroidism, hypertension, erectile dysfunction, first-degree AV block, gallstones, hiatal hernia, atrial flutter diagnosed in 2007, BPH, allergic rhinitis, low back pain, and osteoarthritis, who presents for follow-up consultation of his head tremor (dystonic vs. ET related). He is unaccompanied today. I last saw him on 04/13/15, at which time he reported doing well, no new changes from the tremor standpoint, felt stable. He had some balance issues, reporting 2 falls some months ago: had stepped off a curb and fell on his back and one time he fell on uneven ground, no serious injuries, thankfully. He felt stable on Artane, we mutually agreed to continue with it 3 times a day.  Today, 06/01/2016: He reports more tremor in the R hand. Has had some interim issues with low back pain, was prescribed a muscle relaxer per primary care physician which has helped. He takes Skelaxin twice daily. He has noticed some additional balance problems, does admit that he does not always drink enough water. He estimates that he may drink 3 cups of water per day.   Previously:   I saw him on 04/11/2014, at which time he reported doing well overall, but balance was a little worse. He was on Eliquis per cardiology. He was on Artane 2 mg 3 times a day. I kept him on the same dose of Artane. He was advised to start using a cane for safety.   I first met him on 04/11/2013 at the request of his primary care physician. At the time I suggested he continue with Artane at the current dose.    He was originally diagnosed with essential tremor several years ago by his internist. 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 He previously saw Dr. Morene Antu and was last seen by  him in 2012, and on 03/06/2012 he was seen by our nurse practitioner Ms. Hassell Done. He was deemed stable at the time of his last appointment and was kept on the same medication. He has been taking Artane 2 mg 3 times a day which helps his hand and head tremor. He had some blood work in August of this year which I reviewed: His TSH was low at 0.04, free T4 was normal and total T3 was also in the normal range, PSA was mildly elevated at 4.811. He has been on Artane for several years. He has not been on Mysoline and has been on propranolol for years. He has noted no problems with fine motor skills.   Several years ago, he had Botox injections into the neck, but he did not like it, as the injections were painful and he did not see any benefit.    His Past Medical History Is Significant For: Past Medical History:  Diagnosis Date  . 1st degree AV block   . Atrial flutter ([place])   . BPH (benign prostatic hyperplasia)   . Chicken pox   . Chronic anticoagulation   . Colon polyps   . Colonic adenoma   . Diverticulosis   . Erectile dysfunction   . Gallstones   . Gilbert's syndrome   . Goiter   . Hemorrhoids   . Hiatal hernia   . Hypertension   . Hyperthyroidism   . Hypothyroidism   . Sleep  apnea   . Tremor     His Past Surgical History Is Significant For: Past Surgical History:  Procedure Laterality Date  . APPENDECTOMY    . CARDIOVERSION  03/15/2007   elective DC/x1 with joint pads in the anterior & posterior position/no complications  . left ankle surgery    . NASAL SEPTUM SURGERY    . PALATE / UVULA BIOPSY / EXCISION    . TONSILLECTOMY     with uvula removed  . UVA      His Family History Is Significant For: Family History  Problem Relation Age of Onset  . Heart disease Father   . Cancer Mother     unknown type  . Heart disease Mother   . Cancer Sister     unknown type  . Heart disease Brother     His Social History Is Significant For: Social History   Social History  .  Marital status: Married    Spouse name: N/A  . Number of children: 2  . Years of education: N/A   Occupational History  . financial planner    Social History Main Topics  . Smoking status: Never Smoker  . Smokeless tobacco: Never Used  . Alcohol use 0.0 oz/week     Comment: red wine at night  . Drug use: No  . Sexual activity: Not Currently   Other Topics Concern  . None   Social History Narrative  . None    His Allergies Are:  No Known Allergies:   His Current Medications Are:  Outpatient Encounter Prescriptions as of 06/01/2016  Medication Sig  . apixaban (ELIQUIS) 5 MG TABS tablet Take 1 tablet (5 mg total) by mouth 2 (two) times daily. Please schedule appointment for refills.  . fluticasone (FLONASE) 50 MCG/ACT nasal spray Place 1 spray into the nose as needed.   Marland Kitchen LORazepam (ATIVAN) 1 MG tablet Take 1 mg by mouth daily.    . methocarbamol (ROBAXIN) 500 MG tablet Take 500 mg by mouth 2 (two) times daily.  Marland Kitchen omeprazole (PRILOSEC) 40 MG capsule TAKE 1 CAPSULE BY MOUTH DAILY  . propranolol (INDERAL) 20 MG tablet TAKE 1 TABLET BY MOUTH THREE TIMES DAILY  . trihexyphenidyl (ARTANE) 2 MG tablet TAKE 1 TABLET BY MOUTH THREE TIMES DAILY WITH MEALS   No facility-administered encounter medications on file as of 06/01/2016.   :  Review of Systems:  Out of a complete 14 point review of systems, all are reviewed and negative with the exception of these symptoms as listed below: Review of Systems  Neurological:       Pt presents today to discuss his tremor. He is still walking about 40 minutes daily but notices that he gets more tired after hills.    Objective:  Neurologic Exam  Physical Exam Physical Examination:   Vitals:   06/01/16 1045  BP: 135/82  Pulse: 68  Resp: 20   General Examination: The patient is a very pleasant 82 y.o. male in no acute distress. He appears well-developed and well-nourished and well groomed. He is in good spirits today.  HEENT:  Normocephalic, atraumatic, pupils are equal, round and reactive to light and accommodation. Extraocular tracking is good without limitation to gaze excursion or nystagmus noted. Normal smooth pursuit is noted. Hearing is grossly intact. Face is symmetric with normal facial animation and normal facial sensation. Speech is clear with no dysarthria noted. There is no hypophonia. There is a mild, constant, side-to-side head tremor. Neck is moderately rigid with  decrease in active and passive range of mobility. He has evidence of anterocollis, left laterocollis and a slight right torticollis, unchanged. He has a dystonic head tremor, mostly side to side but irregular and worse with walking. He has no voice tremor, all stable. There are no carotid bruits on auscultation. Oropharynx exam reveals: mild to moderate mouth dryness, adequate dental hygiene and moderate airway crowding, due to larger tongue and narrow airway. He is s/p uvulectomy and TE. Mallampati is class II. Tongue protrudes centrally and palate elevates symmetrically.   Chest: Clear to auscultation without wheezing, rhonchi or crackles noted.  Heart: S1+S2+0, regular and normal without murmurs, rubs or gallops noted.   Abdomen: Soft, non-tender and non-distended with normal bowel sounds appreciated on auscultation.  Extremities: There is trace to 1+ pitting edema in the R ankle.   Skin: Warm and dry without trophic changes noted. There are no varicose veins.  Musculoskeletal: exam reveals no obvious joint deformities, tenderness or joint swelling or erythema.   Neurologically:  Mental status: The patient is awake, alert and oriented in all 4 spheres. His memory, attention, language and knowledge are appropriate. There is no aphasia, agnosia, apraxia or anomia. Speech is clear with normal prosody and enunciation. Thought process is linear. Mood is congruent and affect is normal.  Cranial nerves are as described above under HEENT exam. In  addition, shoulder shrug is normal with unequal shoulder height noted, with a left shoulder elevation.  Motor exam: Normal bulk, strength and tone is noted. There is no drift, or rebound. There is no resting tremor. There is no significant upper extremity postural or action tremor, except for a slight intermittent right hand postural tremor, no significant action tremor.  Reflexes are 1-2+ throughout. Fine motor skills are intact with normal finger taps, normal hand movements, normal rapid alternating patting, normal foot taps and normal foot agility.  Cerebellar testing shows no dysmetria or intention tremor on finger to nose testing. Sensory exam is intact to light touch in the upper and lower extremities.  Gait, station and balance: He stands with mild difficulty and pushes himself up. His stance is slightly wide-based. Romberg is negative. He walks slightly insecurely. He has some difficulty with turns.   Assessment and Plan:    In summary, Kevin Potter is a very pleasant 83 year old male with an underlying medical history of heart disease, obstructive sleep apnea not on CPAP, hypothyroidism, hypertension, erectile dysfunction, first-degree AV block, gallstones, hiatal hernia, atrial flutter diagnosed in 2007, BPH, allergic rhinitis, low back pain, and osteoarthritis, who presents for follow-up consultation of his head tremor. His physical exam is fairly stable. He has a very slight intermittent right hand tremors well. He reports lower issues with his balance of the past few years. His history and exam are most consistent with cervical dystonia and dystonic tremor. He has been on Artane. I advised him that this can cause side effects including balance problems. Artane has been helpful and we mutually agreed to continue but I asked him to increase the dose. Furthermore, most muscle relaxers can cause additional balance problems. In addition, he may not always hydrate well enough and is advised to try to  drink at least 6 glasses of water per day. He does not have a family history of ET. He does have a mild problem with his balance, which is likely a function of medication side effect, advancing age, as well as due to his underlying tremors and change in posture, which affects center  of gravity. He is advised to use his cane for safety. As far as symptomatic treatment is concerned. He has tried Botox injections in the past but does not recall that they were helpful and actually did not feel good after. He has had some success with trihexyphenidyl and denies any major side effects. To that and, I discussed with him continuation of the same medical regimen. He was in agreement. He has routine blood work usually on a 6 monthly basis through his primary care physician. I would like to see him back in 1 year, sooner if needed.  I answered all his questions today and the patient was in agreement. I renewed his prescription for Artane 2 mg 3 times a day today, 90 days supply with refills.  I spent 20 minutes in total face-to-face time with the patient, more than 50% of which was spent in counseling and coordination of care, reviewing test results, reviewing medication and discussing or reviewing the diagnosis of tremor, its prognosis and treatment options.

## 2016-06-01 NOTE — Patient Instructions (Signed)
Please stay better hydrated with water. Try to get 6 glasses of water per day. We will keep you on the Artane 1 pill 3 times daily, keep in mind: side effects include (but are not limited by): mouth dryness, bladder dysfunction, dizziness, blurry vision, upset stomach; rare side effects include (but are not limited by): irregular or slow hear beat, anxiety, hallucinations, confusion, agitation, hyperactivity, nervousness.  Your muscle relaxer may affect your balance. Try to avoid daily use.  Stand up slowly and then start walking slowly, use a cane for safety.   Marland Kitchen

## 2016-07-15 ENCOUNTER — Ambulatory Visit
Admission: RE | Admit: 2016-07-15 | Discharge: 2016-07-15 | Disposition: A | Discharge status: Home / Self Care | Payer: Medicare Other | Source: Ambulatory Visit | Attending: Internal Medicine | Admitting: Internal Medicine

## 2016-07-15 DIAGNOSIS — M545 Low back pain: Principal | ICD-10-CM

## 2016-07-15 DIAGNOSIS — G8929 Other chronic pain: Secondary | ICD-10-CM

## 2016-08-03 ENCOUNTER — Other Ambulatory Visit: Payer: Self-pay | Admitting: Neurology

## 2016-08-28 ENCOUNTER — Other Ambulatory Visit: Payer: Self-pay | Admitting: Internal Medicine

## 2016-08-31 ENCOUNTER — Other Ambulatory Visit: Payer: Self-pay | Admitting: Cardiology

## 2016-09-01 NOTE — Telephone Encounter (Signed)
Last blood work available is from 10/2013.  Request sent to [facility] for most recent blood work results.

## 2016-09-15 ENCOUNTER — Encounter: Payer: Self-pay | Admitting: Podiatry

## 2016-09-15 ENCOUNTER — Ambulatory Visit (INDEPENDENT_AMBULATORY_CARE_PROVIDER_SITE_OTHER): Payer: Medicare Other

## 2016-09-15 ENCOUNTER — Ambulatory Visit (INDEPENDENT_AMBULATORY_CARE_PROVIDER_SITE_OTHER): Payer: Medicare Other | Admitting: Podiatry

## 2016-09-15 DIAGNOSIS — L84 Corns and callosities: Secondary | ICD-10-CM | POA: Diagnosis not present

## 2016-09-15 DIAGNOSIS — M7750 Other enthesopathy of unspecified foot: Secondary | ICD-10-CM

## 2016-09-15 DIAGNOSIS — M79671 Pain in right foot: Secondary | ICD-10-CM

## 2016-09-15 DIAGNOSIS — M779 Enthesopathy, unspecified: Secondary | ICD-10-CM

## 2016-09-15 MED ORDER — NONFORMULARY OR COMPOUNDED ITEM: 0 refills | Status: DC

## 2016-09-15 NOTE — Progress Notes (Signed)
Subjective:     Patient ID: Kevin Potter, male   DOB: Mar 01, 1932, 83 y.o.   MRN: [id number]  HPI 83 year old male presents the office if since her right foot pain which is been ongoing for about 1 year. He states that he likes to walk however due to the pain to the outside aspect of the right foot he had to limit the activity. He states the pain is intermittent and can be hurting when he walks or sits he cannot recall what aggravates his symptoms. He said no recent treatment for this other than he's been on a muscle relaxant from his primary care physician but this has not been helping. He denies any numbness or tingling. He has no other complaints today.  Review of Systems  All other systems reviewed and are negative.      Objective:   Physical Exam General: AAO x3, NAD  Dermatological: Minimal hyperkeratotic tissue right foot Center metatarsal 5. No underlying ulceration, drainage or any signs of infection. No open lesions are identified.  Vascular: Dorsalis Pedis artery and Posterior Tibial artery pedal pulses are 2/4 bilateral with immedate capillary fill time. There is no pain with calf compression, swelling, warmth, erythema.   Neruologic: Grossly intact via light touch bilateral. Vibratory intact via tuning fork bilateral. Protective threshold with Semmes Wienstein monofilament intact to all pedal sites bilateral.   Musculoskeletal: Increasing calcaneal inclination angle. There is subjective tenderness of the fifth metatarsal base of the insertion the peroneal tendon as well as submetatarsal 5 and the right foot. He is not experiencing today but is working gets the symptoms. There is no overlying edema, erythema, increase in warmth. MMT 5/5. Range of motion intact.   Gait: Unassisted, Nonantalgic.      Assessment:     Right foot pain likely due to biomechanical changes    Plan:     -Treatment options discussed including all alternatives, risks, and complications -Etiology of  symptoms were discussed -X-rays were obtained and reviewed with the patient.  No evidence of acute fracture identified today. -Hyperkeratotic lesion lightly debrided without any complications or bleeding 1. -Power steps were dispensed I added a metatarsal pad to this. -We'll hold off on steroid injection as he is not having pain today. -Stretching exercises discussed. -Follow-up 4-6 weeks or sooner. Call any questions or concerns.  Celesta Gentile, DPM

## 2016-09-15 NOTE — Addendum Note (Signed)
Addended by: Tomi Bamberger on: 09/15/2016 04:48 PM   Modules accepted: Orders

## 2016-10-01 ENCOUNTER — Other Ambulatory Visit: Payer: Self-pay | Admitting: Cardiology

## 2016-10-20 ENCOUNTER — Ambulatory Visit: Payer: Medicare Other | Admitting: Podiatry

## 2016-10-28 ENCOUNTER — Encounter: Payer: Self-pay | Admitting: Podiatry

## 2016-10-28 ENCOUNTER — Ambulatory Visit (INDEPENDENT_AMBULATORY_CARE_PROVIDER_SITE_OTHER): Payer: Medicare Other | Admitting: Podiatry

## 2016-10-28 DIAGNOSIS — L84 Corns and callosities: Secondary | ICD-10-CM

## 2016-10-28 DIAGNOSIS — M79671 Pain in right foot: Secondary | ICD-10-CM

## 2016-11-03 ENCOUNTER — Telehealth: Payer: Self-pay | Admitting: *Deleted

## 2016-11-03 NOTE — Progress Notes (Signed)
Subjective: 83 year old male presents the office they for follow-up with vibration of right foot pain. He points to submetatarsal 5 along an area of a small callus radius majority of symptoms. He did get however steps and since wearing that they have been helping quite a bit so get some discomfort to the foot. He denies any increase in swelling or redness or any. He has been concerns today. Denies any systemic complaints such as fevers, chills, nausea, vomiting. No acute changes since last appointment, and no other complaints at this time.   Objective: AAO x3, NAD DP/PT pulses palpable bilaterally, CRT less than 3 seconds The majority tenderness today to his foot appears to be localized the right foot second metatarsal 5 along the area of a painful callus. Upon debridement there is no underlying ulceration, drainage or clinical signs of infection. There is no area pinpoint bony tenderness or pain the vibratory sensation. Increase in medial arch height present. No open lesions or pre-ulcerative lesions.  No pain with calf compression, swelling, warmth, erythema  Assessment: Right foot pain due to biomechanical changes, hyperkeratotic lesion  Plan: -All treatment options discussed with the patient including all alternatives, risks, complications.  -I sharply debrided hyperkeratotic lesion to the any complications or bleeding. -I modified his insert with offloading pads to take pressure off the symptomatic areas. -Also discussed changing shoe gear. -Patient encouraged to call the office with any questions, concerns, change in symptoms.   Celesta Gentile, DPM

## 2016-11-03 NOTE — Telephone Encounter (Addendum)
Pt states Dr.Wagoner took his shoe yesterday to adjust the pad and he would like to know if the shoe is ready.11/04/2016-I informed pt that his insole had been completed for his shoe and he could pick up the shoe today before 4:00pm.

## 2016-11-03 NOTE — Telephone Encounter (Signed)
I don't think I took his shoe. I modified his insert for him in the office and gave it back to him.

## 2016-11-30 ENCOUNTER — Other Ambulatory Visit: Payer: Self-pay | Admitting: Cardiology

## 2017-02-05 ENCOUNTER — Emergency Department (HOSPITAL_COMMUNITY)
Admission: EM | Admit: 2017-02-05 | Discharge: 2017-02-05 | Disposition: A | Discharge status: Home / Self Care | Payer: Medicare Other | Attending: Emergency Medicine | Admitting: Emergency Medicine

## 2017-02-05 ENCOUNTER — Other Ambulatory Visit: Payer: Self-pay

## 2017-02-05 ENCOUNTER — Emergency Department (HOSPITAL_COMMUNITY): Payer: Medicare Other

## 2017-02-05 ENCOUNTER — Encounter (HOSPITAL_COMMUNITY): Payer: Self-pay | Admitting: *Deleted

## 2017-02-05 DIAGNOSIS — R05 Cough: Secondary | ICD-10-CM

## 2017-02-05 DIAGNOSIS — Z79899 Other long term (current) drug therapy: Secondary | ICD-10-CM | POA: Diagnosis not present

## 2017-02-05 DIAGNOSIS — E039 Hypothyroidism, unspecified: Secondary | ICD-10-CM | POA: Insufficient documentation

## 2017-02-05 DIAGNOSIS — R059 Cough, unspecified: Secondary | ICD-10-CM

## 2017-02-05 DIAGNOSIS — W109XXA Fall (on) (from) unspecified stairs and steps, initial encounter: Secondary | ICD-10-CM | POA: Diagnosis not present

## 2017-02-05 DIAGNOSIS — I10 Essential (primary) hypertension: Secondary | ICD-10-CM | POA: Insufficient documentation

## 2017-02-05 DIAGNOSIS — Y929 Unspecified place or not applicable: Secondary | ICD-10-CM | POA: Diagnosis not present

## 2017-02-05 DIAGNOSIS — S2242XA Multiple fractures of ribs, left side, initial encounter for closed fracture: Secondary | ICD-10-CM | POA: Diagnosis not present

## 2017-02-05 DIAGNOSIS — W010XXA Fall on same level from slipping, tripping and stumbling without subsequent striking against object, initial encounter: Secondary | ICD-10-CM

## 2017-02-05 DIAGNOSIS — Y999 Unspecified external cause status: Secondary | ICD-10-CM | POA: Insufficient documentation

## 2017-02-05 DIAGNOSIS — S299XXA Unspecified injury of thorax, initial encounter: Secondary | ICD-10-CM | POA: Diagnosis present

## 2017-02-05 DIAGNOSIS — Y9301 Activity, walking, marching and hiking: Secondary | ICD-10-CM | POA: Diagnosis not present

## 2017-02-05 DIAGNOSIS — W19XXXA Unspecified fall, initial encounter: Secondary | ICD-10-CM

## 2017-02-05 LAB — CBC
HEMATOCRIT: 41.2 % (ref 39.0–52.0)
Hemoglobin: 14.3 g/dL (ref 13.0–17.0)
MCH: 31.7 pg (ref 26.0–34.0)
MCHC: 34.7 g/dL (ref 30.0–36.0)
MCV: 91.4 fL (ref 78.0–100.0)
Platelets: 182 10*3/uL (ref 150–400)
RBC: 4.51 MIL/uL (ref 4.22–5.81)
RDW: 13.5 % (ref 11.5–15.5)
WBC: 8.3 10*3/uL (ref 4.0–10.5)

## 2017-02-05 LAB — BASIC METABOLIC PANEL
ANION GAP: 9 (ref 5–15)
BUN: 12 mg/dL (ref 6–20)
CALCIUM: 9.5 mg/dL (ref 8.9–10.3)
CO2: 28 mmol/L (ref 22–32)
CREATININE: 0.68 mg/dL (ref 0.61–1.24)
Chloride: 104 mmol/L (ref 101–111)
GFR calc Af Amer: >60 mL/min (ref >60)
GFR calc non Af Amer: >60 mL/min (ref >60)
GLUCOSE: 96 mg/dL (ref 65–99)
Potassium: 4 mmol/L (ref 3.5–5.1)
Sodium: 141 mmol/L (ref 135–145)

## 2017-02-05 MED ORDER — TRAMADOL HCL 50 MG PO TABS: 50.0000 mg | ORAL_TABLET | Freq: Four times a day (QID) | ORAL | 0 refills | Status: DC | PRN

## 2017-02-05 MED ORDER — ALBUTEROL SULFATE (2.5 MG/3ML) 0.083% IN NEBU
5.0000 mg | INHALATION_SOLUTION | Freq: Once | RESPIRATORY_TRACT | Status: AC
  Administered 2017-02-05: 5 mg via RESPIRATORY_TRACT
  Filled 2017-02-05: qty 6

## 2017-02-05 MED ORDER — DEXTROSE 5 % IV SOLN
500.0000 mg | Freq: Once | INTRAVENOUS | Status: AC
  Administered 2017-02-05: 500 mg via INTRAVENOUS
  Filled 2017-02-05: qty 500

## 2017-02-05 MED ORDER — TRAMADOL HCL 50 MG PO TABS
50.0000 mg | ORAL_TABLET | Freq: Once | ORAL | Status: AC
  Administered 2017-02-05: 50 mg via ORAL
  Filled 2017-02-05: qty 1

## 2017-02-05 MED ORDER — ACETAMINOPHEN 325 MG PO TABS
650.0000 mg | ORAL_TABLET | Freq: Once | ORAL | Status: AC
  Administered 2017-02-05: 650 mg via ORAL
  Filled 2017-02-05: qty 2

## 2017-02-05 MED ORDER — DEXTROSE 5 % IV SOLN
1.0000 g | Freq: Once | INTRAVENOUS | Status: AC
  Administered 2017-02-05: 1 g via INTRAVENOUS
  Filled 2017-02-05: qty 10

## 2017-02-05 MED ORDER — DOXYCYCLINE HYCLATE 100 MG PO CAPS: 100.0000 mg | ORAL_CAPSULE | Freq: Two times a day (BID) | ORAL | 0 refills | Status: DC

## 2017-02-05 NOTE — Discharge Instructions (Addendum)
It was our pleasure to provide your ER care today - we hope that you feel better.  Use incentive spirometer, 10 full and complete breaths in, expanding your lungs as fully as possible, every hour while awake.  Take antibiotic as prescribed.  Take acetaminophen as need for pain.  You may also take ultram as need for pain - no driving tonight, or when taking ultram.   Follow up with primary care doctor Tuesday for recheck.  Your CT was read as showing: IMPRESSION: 1. Small bilateral pleural effusions, left greater than right. Left rib fractures better assessed on concurrent left rib radiographs. 2. 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  Discuss above results with your primary care doctor at follow up with them.   Return to ER right away if worse, increased trouble breathing, other concern.

## 2017-02-05 NOTE — ED Provider Notes (Signed)
[facility] DEPT Provider Note   CSN: [id number] Arrival date & time: 02/05/17  1734     History   Chief Complaint Chief Complaint  Patient presents with  . Fall  . Shortness of Breath    HPI NOOR VIDALES is a 83 y.o. male.  Patient s/p mechanical fall 3 days ago. Missed a step, fell against left ribs/chest. C/o pain to area since, dull, moderate, worse w cough. Also notes productive cough. No sore throat or runny nose. No fever. Is on eliquis for hx afib. Denies head contusion or injury. No headaches since fall. Bruising to left chest wall were he hit it. No other abnormal bruising or bleeding. Denies other pain or injury. With cough, feels rattling/congestion in chest, mild sob. No hx chronic lung disease. Non smoker. Denies neck or back pain. Ambulatory since fall.    The history is provided by the patient.  Fall  Associated symptoms include shortness of breath. Pertinent negatives include no abdominal pain and no headaches.  Shortness of Breath  Associated symptoms include cough. Pertinent negatives include no fever, no headaches, no sore throat, no neck pain, no vomiting, no abdominal pain, no rash and no leg swelling.    Past Medical History:  Diagnosis Date  . 1st degree AV block   . Atrial flutter ([place])   . BPH (benign prostatic hyperplasia)   . Chicken pox   . Chronic anticoagulation   . Colon polyps   . Colonic adenoma   . Diverticulosis   . Erectile dysfunction   . Gallstones   . Gilbert's syndrome   . Goiter   . Hemorrhoids   . Hiatal hernia   . Hypertension   . Hyperthyroidism   . Hypothyroidism   . Sleep apnea   . Tremor     Patient Active Problem List   Diagnosis Date Noted  . Dyspnea 12/14/2012  . Atrial flutter ([place])   . Chronic anticoagulation   . Hypertension     Past Surgical History:  Procedure Laterality Date  . APPENDECTOMY    . CARDIOVERSION  03/15/2007   elective DC/x1 with joint pads in the anterior & posterior position/no  complications  . left ankle surgery    . NASAL SEPTUM SURGERY    . PALATE / UVULA BIOPSY / EXCISION    . TONSILLECTOMY     with uvula removed  . UVA         Home Medications    Prior to Admission medications   Medication Sig Start Date End Date Taking? Authorizing Provider  ELIQUIS 5 MG TABS tablet TAKE ONE TABLET BY MOUTH TWICE A DAY 11/30/16   Martinique, Peter M, MD  fluticasone [facility]) 50 MCG/ACT nasal spray Place 1 spray into the nose as needed.  09/12/11   [provider]  LORazepam (ATIVAN) 1 MG tablet Take 1 mg by mouth daily.      [provider]  methocarbamol (ROBAXIN) 500 MG tablet Take 500 mg by mouth 2 (two) times daily.    [provider]  NONFORMULARY OR COMPOUNDED [place]  Anti-Inflammatory Cream- Diclofenac 3%, Baclofen 2%, Lidocaine 2% Apply 1-2 grams to affected area 3-4 times daily Qty. 120 gm 3 refills 09/15/16   Trula Slade, DPM  omeprazole (PRILOSEC) 40 MG capsule TAKE 1 CAPSULE BY MOUTH DAILY 08/29/16   Irene Shipper, MD  propranolol (INDERAL) 20 MG tablet TAKE 1 TABLET BY MOUTH THREE TIMES DAILY 08/03/16   Star Age, MD  trihexyphenidyl (  ARTANE) 2 MG tablet Take 1 tablet (2 mg total) by mouth 3 (three) times daily with meals. 06/01/16   Star Age, MD    Family History Family History  Problem Relation Age of Onset  . Heart disease Father   . Cancer Mother        unknown type  . Heart disease Mother   . Cancer Sister        unknown type  . Heart disease Brother     Social History Social History  Substance Use Topics  . Smoking status: Never Smoker  . Smokeless tobacco: Never Used  . Alcohol use 0.0 oz/week     Comment: red wine at night     Allergies   Patient has no known allergies.   Review of Systems Review of Systems  Constitutional: Negative for fever.  HENT: Negative for sore throat.   Eyes: Negative for redness.  Respiratory: Positive for cough and shortness of breath.     Cardiovascular: Negative for leg swelling.  Gastrointestinal: Negative for abdominal pain, nausea and vomiting.  Genitourinary: Negative for flank pain.  Musculoskeletal: Negative for back pain and neck pain.  Skin: Negative for rash.  Neurological: Negative for headaches.  Hematological: Negative for adenopathy.  Psychiatric/Behavioral: Negative for confusion.     Physical Exam Updated Vital Signs BP (!) 144/79   Pulse 87   Temp 98.7 F (37.1 C) (Oral)   Resp 17   Ht 1.88 m (6\' 2" )   Wt 88.5 kg (195 lb)   SpO2 95%   BMI 25.04 kg/m   Physical Exam  Constitutional: He is oriented to person, place, and time. He appears well-developed and well-nourished. No distress.  HENT:  Head: Atraumatic.  Mouth/Throat: Oropharynx is clear and moist.  Eyes: Pupils are equal, round, and reactive to light. Conjunctivae are normal.  Neck: Neck supple. No tracheal deviation present.  Cardiovascular: Normal rate, normal heart sounds and intact distal pulses.  Exam reveals no gallop and no friction rub.   No murmur heard. Pulmonary/Chest: Effort normal and breath sounds normal. No accessory muscle usage. No respiratory distress. He exhibits tenderness.  Left chest wall tenderness and bruising. No crepitus. Normal chest wall movement.  Few rhonchi left.   Abdominal: Soft. Bowel sounds are normal. He exhibits no distension. There is no tenderness.  Musculoskeletal: He exhibits no edema.  CTLS spine, non tender, aligned, no step off. Good rom bil ext without pain or focal bony tenderness.   Neurological: He is alert and oriented to person, place, and time.  Speech clear/fluent. Ambulates w steady gait.   Skin: Skin is warm and dry. He is not diaphoretic.  Psychiatric: He has a normal mood and affect.  Nursing note and vitals reviewed.    ED Treatments / Results  Labs (all labs ordered are listed, but only abnormal results are displayed) Results for orders placed or performed during the  hospital encounter of 02/05/17  CBC  Result Value Ref Range   WBC 8.3 4.0 - 10.5 K/uL   RBC 4.51 4.22 - 5.81 MIL/uL   Hemoglobin 14.3 13.0 - 17.0 g/dL   HCT 41.2 39.0 - 52.0 %   MCV 91.4 78.0 - 100.0 fL   MCH 31.7 26.0 - 34.0 pg   MCHC 34.7 30.0 - 36.0 g/dL   RDW 13.5 11.5 - 15.5 %   Platelets 182 150 - 400 K/uL  Basic metabolic panel  Result Value Ref Range   Sodium 141 135 - 145 mmol/L  Potassium 4.0 3.5 - 5.1 mmol/L   Chloride 104 101 - 111 mmol/L   CO2 28 22 - 32 mmol/L   Glucose, Bld 96 65 - 99 mg/dL   BUN 12 6 - 20 mg/dL   Creatinine, Ser 0.68 0.61 - 1.24 mg/dL   Calcium 9.5 8.9 - 10.3 mg/dL   GFR calc non Af Amer >60 >60 mL/min   GFR calc Af Amer >60 >60 mL/min   Anion gap 9 5 - 15   Dg Chest 2 View  Result Date: 02/05/2017 CLINICAL DATA:  Cough.  Fall 3 days prior.  Left lower rib bruising. EXAM: CHEST  2 VIEW COMPARISON:  Chest CTA 10/11/2012 Concurrently performed left rib radiographs. FINDINGS: The heart is normal in size. Large rounded right paratracheal density and leftward tracheal deviation secondary to substernal thyroid goiter, unchanged from prior chest CT. Small bilateral pleural effusions, left greater than right. Associated streaky left basilar opacity. Degenerative change in the lumbar spine. Left rib fractures better assessed on concurrent rib radiographs. IMPRESSION: 1. Small bilateral pleural effusions, left greater than right. Left rib fractures better assessed on concurrent left rib radiographs. 2. 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 Electronically Signed   By: Jeb Levering M.D.   On: 02/05/2017 19:00   Dg Ribs Unilateral Left  Result Date: 02/05/2017 CLINICAL DATA:  Fall 3 days ago with left lower rib pain and bruising. EXAM: LEFT RIBS - 2 VIEW COMPARISON:  Concurrent chest radiographs.  Chest CT 10/11/2012 FINDINGS: Nondisplaced fractures of left lateral eighth and ninth ribs. Small left pleural  effusion and likely atelectasis at the left lung base. No evidence of pneumothorax. Mediastinal contours better assessed on concurrent chest radiographs reported separately. IMPRESSION: 1. Nondisplaced left lateral eighth and ninth rib fractures. 2. Small pleural effusion and likely basilar atelectasis. No evidence of pneumothorax. Electronically Signed   By: Jeb Levering M.D.   On: 02/05/2017 19:06    EKG  EKG Interpretation None       Radiology Dg Chest 2 View  Result Date: 02/05/2017 CLINICAL DATA:  Cough.  Fall 3 days prior.  Left lower rib bruising. EXAM: CHEST  2 VIEW COMPARISON:  Chest CTA 10/11/2012 Concurrently performed left rib radiographs. FINDINGS: The heart is normal in size. Large rounded right paratracheal density and leftward tracheal deviation secondary to substernal thyroid goiter, unchanged from prior chest CT. Small bilateral pleural effusions, left greater than right. Associated streaky left basilar opacity. Degenerative change in the lumbar spine. Left rib fractures better assessed on concurrent rib radiographs. IMPRESSION: 1. Small bilateral pleural effusions, left greater than right. Left rib fractures better assessed on concurrent left rib radiographs. 2. 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 Electronically Signed   By: Jeb Levering M.D.   On: 02/05/2017 19:00   Dg Ribs Unilateral Left  Result Date: 02/05/2017 CLINICAL DATA:  Fall 3 days ago with left lower rib pain and bruising. EXAM: LEFT RIBS - 2 VIEW COMPARISON:  Concurrent chest radiographs.  Chest CT 10/11/2012 FINDINGS: Nondisplaced fractures of left lateral eighth and ninth ribs. Small left pleural effusion and likely atelectasis at the left lung base. No evidence of pneumothorax. Mediastinal contours better assessed on concurrent chest radiographs reported separately. IMPRESSION: 1. Nondisplaced left lateral eighth and ninth rib fractures. 2. Small pleural  effusion and likely basilar atelectasis. No evidence of pneumothorax. Electronically Signed   By: Jeb Levering M.D.   On: 02/05/2017 19:06  Procedures Procedures (including critical care time)  Medications Ordered in ED Medications  cefTRIAXone (ROCEPHIN) 1 g in dextrose 5 % 50 mL IVPB (not administered)  azithromycin (ZITHROMAX) 500 mg in dextrose 5 % 250 mL IVPB (not administered)  albuterol (PROVENTIL) (2.5 MG/3ML) 0.083% nebulizer solution 5 mg (5 mg Nebulization Given 02/05/17 1806)     Initial Impression / Assessment and Plan / ED Course  I have reviewed the triage vital signs and the nursing notes.  Pertinent labs & imaging results that were available during my care of the patient were reviewed by me and considered in my medical decision making (see chart for details).  Xrays.   Discussed rib fx w pt. Acetaminophen po. Ultram po.  Suspect developing resp infxn although no pna on cxr.  Rocephin and zithromax iv.  Incentive spirometer.   Reviewed nursing notes and prior charts for additional history.   Recheck, breathing comfortably.  Pt appears stable for d/c.     Final Clinical Impressions(s) / ED Diagnoses   Final diagnoses:  Fall    New Prescriptions New Prescriptions   No medications on file     Lajean Saver, MD 02/05/17 2040

## 2017-02-05 NOTE — ED Notes (Signed)
Patient instructed on use of incentive spirometer. Teach back method used.

## 2017-02-05 NOTE — ED Triage Notes (Signed)
Pt states he has a "rattle in my chest at night" for the last month. Fell 3 days ago and has significant brusing on left side, pt is on blood thinners.

## 2017-03-22 ENCOUNTER — Ambulatory Visit (INDEPENDENT_AMBULATORY_CARE_PROVIDER_SITE_OTHER): Payer: Medicare Other | Admitting: Physician Assistant

## 2017-03-22 ENCOUNTER — Encounter: Payer: Self-pay | Admitting: Physician Assistant

## 2017-03-22 VITALS — BP 120/80 | HR 68 | Ht 72.0 in | Wt 185.0 lb

## 2017-03-22 DIAGNOSIS — R1314 Dysphagia, pharyngoesophageal phase: Secondary | ICD-10-CM | POA: Diagnosis not present

## 2017-03-22 DIAGNOSIS — K219 Gastro-esophageal reflux disease without esophagitis: Secondary | ICD-10-CM | POA: Diagnosis not present

## 2017-03-22 MED ORDER — OMEPRAZOLE 40 MG PO CPDR: 40.0000 mg | DELAYED_RELEASE_CAPSULE | Freq: Two times a day (BID) | ORAL | 3 refills | Status: AC

## 2017-03-22 NOTE — Progress Notes (Signed)
Initial assessment and plans reviewed 

## 2017-03-22 NOTE — Patient Instructions (Signed)
We have sent the following medications to your pharmacy for you to pick up at your convenience: Omeprazole 40 mg twice a day 30-60 minutes before breakfast and dinner.  You have been scheduled for a Barium Esophogram at [facility] (1st floor of the hospital) on 03-24-17 at 9:30 am. Please arrive 15 minutes prior to your appointment for registration. Make certain not to have anything to eat or drink 6 hours prior to your test. If you need to reschedule for any reason, please contact radiology at [phone] to do so. __________________________________________________________________ A barium swallow is an examination that concentrates on views of the esophagus. This tends to be a double contrast exam (barium and two liquids which, when combined, create a gas to distend the wall of the oesophagus) or single contrast (non-ionic iodine based). The study is usually tailored to your symptoms so a good history is essential. Attention is paid during the study to the form, structure and configuration of the esophagus, looking for functional disorders (such as aspiration, dysphagia, achalasia, motility and reflux) EXAMINATION You may be asked to change into a gown, depending on the type of swallow being performed. A radiologist and radiographer will perform the procedure. The radiologist will advise you of the type of contrast selected for your procedure and direct you during the exam. You will be asked to stand, sit or lie in several different positions and to hold a small amount of fluid in your mouth before being asked to swallow while the imaging is performed .In some instances you may be asked to swallow barium coated marshmallows to assess the motility of a solid food bolus. The exam can be recorded as a digital or video fluoroscopy procedure. POST PROCEDURE It will take 1-2 days for the barium to pass through your system. To facilitate this, it is important, unless otherwise directed, to increase  your fluids for the next 24-48hrs and to resume your normal diet.  This test typically takes about 30 minutes to perform. __________________________________________________________________________________

## 2017-03-22 NOTE — Progress Notes (Signed)
Chief Complaint: Dysphagia, GERD  HPI:  Kevin Potter is an 83 year old Caucasian male with a past medical history as listed below including A. fib on elevated risk,  who was referred to me by Crist Infante, MD for a complaint of dysphagia and GERD.      Please recall patient follows with Dr. Henrene Pastor and was last seen 04/12/16. At that time, he was noted patient had multiple significant medical problems and was questioning the need for PPI therapy for his chronic reflux. Patient's last upper endoscopy was performed April 2013 with a stricture but was otherwise normal. At that time discussion was had about PPIs versus H2 blockers and recommendations.    Today, the patient presents to clinic and explains that he went off of his PPI for about 2 weeks but this was "a mistake". Apparently, he had uncontrolled reflux symptoms during that time. Most recently, the patient was in the hospital after a fall and had treatment for a lung fracture and rib fracture. He describes that during that time it seems as though his reflux got worse. Patient notes that now it feels as though "my speech is blocked", as well as "burning in my esophagus when I lay down". Patient describes that every time he eats, food tends to get hung up in his throat. He has to spend a large amount of time chewing before actually swallowing his food down in order not to have a problem. He denies any trouble with liquids. It has been getting worse over the past 2-3 months. Patient is currently on Omeprazole 40 mg once daily but did increase this to twice a day for about 2 weeks per his PCP. Patient does describe some relief of these symptoms when on a twice a day PPI.    Patient denies fever, chills, blood in the stool, melena, anorexia, change in bowel habits or abdominal pain.  Past Medical History:  Diagnosis Date  . 1st degree AV block   . Atrial flutter ([place])   . BPH (benign prostatic hyperplasia)   . Chicken pox   . Chronic anticoagulation     . Colon polyps   . Colonic adenoma   . Diverticulosis   . Erectile dysfunction   . Gallstones   . Gilbert's syndrome   . Goiter   . Hemorrhoids   . Hiatal hernia   . Hypertension   . Hyperthyroidism   . Hypothyroidism   . Sleep apnea   . Tremor     Past Surgical History:  Procedure Laterality Date  . APPENDECTOMY    . CARDIOVERSION  03/15/2007   elective DC/x1 with joint pads in the anterior & posterior position/no complications  . left ankle surgery    . NASAL SEPTUM SURGERY    . PALATE / UVULA BIOPSY / EXCISION    . TONSILLECTOMY     with uvula removed  . UVA      Current Outpatient Prescriptions  Medication Sig Dispense Refill  . doxycycline (VIBRAMYCIN) 100 MG capsule Take 1 capsule (100 mg total) by mouth 2 (two) times daily. 14 capsule 0  . ELIQUIS 5 MG TABS tablet TAKE ONE TABLET BY MOUTH TWICE A DAY 180 tablet 1  . fluticasone (FLONASE) 50 MCG/ACT nasal spray Place 1 spray into the nose as needed.     Marland Kitchen LORazepam (ATIVAN) 1 MG tablet Take 1 mg by mouth daily.      . NONFORMULARY OR COMPOUNDED [place]  Anti-Inflammatory Cream- Diclofenac 3%, Baclofen 2%, Lidocaine  2% Apply 1-2 grams to affected area 3-4 times daily Qty. 120 gm 3 refills 1 each 0  . omeprazole (PRILOSEC) 40 MG capsule TAKE 1 CAPSULE BY MOUTH DAILY 30 capsule 6  . propranolol (INDERAL) 20 MG tablet TAKE 1 TABLET BY MOUTH THREE TIMES DAILY 90 tablet 10  . traMADol (ULTRAM) 50 MG tablet Take 1 tablet (50 mg total) by mouth every 6 (six) hours as needed. 20 tablet 0  . trihexyphenidyl (ARTANE) 2 MG tablet Take 1 tablet (2 mg total) by mouth 3 (three) times daily with meals. 270 tablet 3   No current facility-administered medications for this visit.     Allergies as of 03/22/2017  . (No Known Allergies)    Family History  Problem Relation Age of Onset  . Heart disease Father   . Cancer Mother        unknown type  . Heart disease Mother   . Cancer Sister        unknown type   . Heart disease Brother     Social History   Social History  . Marital status: Married    Spouse name: N/A  . Number of children: 2  . Years of education: N/A   Occupational History  . financial planner    Social History Main Topics  . Smoking status: Never Smoker  . Smokeless tobacco: Never Used  . Alcohol use 0.0 oz/week     Comment: red wine at night  . Drug use: No  . Sexual activity: Not Currently   Other Topics Concern  . Not on file   Social History Narrative  . No narrative on file    Review of Systems:    Constitutional: No weight loss, fever or chills Cardiovascular: No chest pain Respiratory: No SOB  Gastrointestinal: See HPI and otherwise negative   Physical Exam:  Vital signs: BP 120/80   Pulse 68   Ht 6' (1.829 m)   Wt 185 lb (83.9 kg)   SpO2 98%   BMI 25.09 kg/m   Constitutional:   Pleasant Caucasian male appears to be in NAD, Well developed, Well nourished, alert and cooperative Respiratory: Respirations even and unlabored. Lungs clear to auscultation bilaterally.   No wheezes, crackles, or rhonchi.  Cardiovascular: Normal S1, S2. No MRG. Regular rate and rhythm. No peripheral edema, cyanosis or pallor.  Gastrointestinal:  Soft, nondistended, nontender. No rebound or guarding. Normal bowel sounds. No appreciable masses or hepatomegaly. Psychiatric:  Demonstrates good judgement and reason without abnormal affect or behaviors.  MOST RECENT LABS AND IMAGING: CBC    Component Value Date/Time   WBC 8.3 02/05/2017 1949   RBC 4.51 02/05/2017 1949   HGB 14.3 02/05/2017 1949   HCT 41.2 02/05/2017 1949   PLT 182 02/05/2017 1949   MCV 91.4 02/05/2017 1949   MCH 31.7 02/05/2017 1949   MCHC 34.7 02/05/2017 1949   RDW 13.5 02/05/2017 1949   LYMPHSABS 1.2 06/12/2012 0905   MONOABS 0.6 06/12/2012 0905   EOSABS 0.1 06/12/2012 0905   BASOSABS 0.1 06/12/2012 0905    CMP     Component Value Date/Time   NA 141 02/05/2017 1949   K 4.0 02/05/2017  1949   CL 104 02/05/2017 1949   CO2 28 02/05/2017 1949   GLUCOSE 96 02/05/2017 1949   BUN 12 02/05/2017 1949   CREATININE 0.68 02/05/2017 1949   CALCIUM 9.5 02/05/2017 1949   PROT 6.1 06/18/2009 2212   ALBUMIN 3.7 06/18/2009 2212   AST 24  06/18/2009 2212   ALT 23 06/18/2009 2212   ALKPHOS 52 06/18/2009 2212   BILITOT 1.9 (H) 06/18/2009 2212   GFRNONAA >60 02/05/2017 1949   GFRAA >60 02/05/2017 1949    Assessment: 1. GERD: Uncontrolled on Omeprazole 40 mg daily 2. Dysphagia: Worse over the past 2-3 months, better on a twice a day PPI which patient took for only 2 weeks, now on once daily dosing; history of peptic stricture, consider the same versus esophagitis versus other  Plan: 1. Increased patient's Omeprazole to 40 mg twice a day, 30-60 minutes beffore breakfast and dinner. #60 with 3 refills 2. Reviewed anti-dysphagia measures including taking small bites, drinking sips of water between bites, avoiding distraction while eating and the chin tuck technique 3. Ordered a barium esophagram with tablet 4. Discussed with the patient that we will have him follow up to see how he is feeling after being on an increased dosage of PPI, if he has relief of his dysphagia symptoms, there will be no need for an EGD. 4. Patient will follow Dr. Henrene Pastor in 2 months or sooner if necessary. Certainly if imaging reveals a stricture, could proceed with EGD in the interim. Would need to hold Eliquis.  Ellouise Newer, PA-C [facility] 03/22/2017, 10:37 AM  Cc: Crist Infante, MD

## 2017-03-24 ENCOUNTER — Ambulatory Visit (HOSPITAL_COMMUNITY)
Admission: RE | Admit: 2017-03-24 | Discharge: 2017-03-24 | Disposition: A | Discharge status: Home / Self Care | Payer: Medicare Other | Source: Ambulatory Visit | Attending: Physician Assistant | Admitting: Physician Assistant

## 2017-03-24 DIAGNOSIS — R05 Cough: Secondary | ICD-10-CM | POA: Insufficient documentation

## 2017-03-24 DIAGNOSIS — E049 Nontoxic goiter, unspecified: Secondary | ICD-10-CM | POA: Insufficient documentation

## 2017-03-24 DIAGNOSIS — R1314 Dysphagia, pharyngoesophageal phase: Secondary | ICD-10-CM | POA: Diagnosis not present

## 2017-03-24 DIAGNOSIS — K219 Gastro-esophageal reflux disease without esophagitis: Secondary | ICD-10-CM | POA: Diagnosis not present

## 2017-04-04 ENCOUNTER — Telehealth: Payer: Self-pay | Admitting: Physician Assistant

## 2017-04-04 NOTE — Telephone Encounter (Signed)
Levin Erp, [place] sent to Jeoffrey Massed, RN        Please see Dr. Blanch Media notes. Would you mind arranging all of these things? Thanks so much! Appreciate you -JLL

## 2017-04-04 NOTE — Telephone Encounter (Signed)
Notes recorded by Irene Shipper, MD on 03/24/2017 at 1:10 PM EDT Recommend the following (after sharing the result information with the patient): 1. Modified barium swallow with speech pathology "oropharyngeal dysphagia with aspiration, evaluate" 2. Diagnostic EGD(assess for degree of extrinsic compression from goiter). He can stay on Eliquis 3. Neurology evaluation for oral pharyngeal dysphagia with aspiration 4. Please send a copy of this report with my comments to Dr. Joylene Draft

## 2017-04-05 ENCOUNTER — Other Ambulatory Visit: Payer: Self-pay

## 2017-04-05 ENCOUNTER — Other Ambulatory Visit (HOSPITAL_COMMUNITY): Payer: Self-pay | Admitting: Internal Medicine

## 2017-04-05 DIAGNOSIS — K222 Esophageal obstruction: Secondary | ICD-10-CM

## 2017-04-05 DIAGNOSIS — R1314 Dysphagia, pharyngoesophageal phase: Secondary | ICD-10-CM

## 2017-04-05 DIAGNOSIS — R1310 Dysphagia, unspecified: Secondary | ICD-10-CM

## 2017-04-05 NOTE — Progress Notes (Signed)
The pt has been given the appt information and all instructions for the procedures and will call with any questions,  previsit for 11/28.    Referral sent to Neurology, they will call the pt with appt  You have been scheduled for an endoscopy on 05/09/17. Please follow written instructions given to you at your visit today. If you use inhalers (even only as needed), please bring them with you on the day of your procedure. Your physician has requested that you go to www.startemmi.com and enter the access code given to you at your visit today. This web site gives a general overview about your procedure. However, you should still follow specific instructions given to you by our office regarding your preparation for the procedure.   Dr Joylene Draft scheduled  for a modified barium swallow on 04/11/17 at 1 pm. Please arrive 15 minutes prior to your test for registration. You will go to [facility]iology (1st Floor) for your appointment. Please refrain from eating or drinking anything 4 hours prior to your test. Should you need to cancel or reschedule your appointment, please contact [phone] [facility]) or [phone] [place]). _____________________________________________________________________ A Modified Barium Swallow Study, or MBS, is a special x-ray that is taken to check swallowing skills. It is carried out by a Stage manager and a Psychologist, clinical (SLP). During this test, yourmouth, throat, and esophagus, a muscular tube which connects your mouth to your stomach, is checked. The test will help you, your doctor, and the SLP plan what types of foods and liquids are easier for you to swallow. The SLP will also identify positions and ways to help you swallow more easily and safely. What will happen during an MBS? You will be taken to an x-ray room and seated comfortably. You will be asked to swallow small amounts of food and liquid mixed with barium. Barium is a liquid or paste that allows images  of your mouth, throat and esophagus to be seen on x-ray. The x-ray captures moving images of the food you are swallowing as it travels from your mouth through your throat and into your esophagus. This test helps identify whether food or liquid is entering your lungs (aspiration). The test also shows which part of your mouth or throat lacks strength or coordination to move the food or liquid in the right direction. This test typically takes 30 minutes to 1 hour to complete. _______________________________________________________________________

## 2017-04-05 NOTE — Telephone Encounter (Signed)
The pt has been given the appt information and all instructions for the procedures and will call with any questions,  previsit for 11/28.    Referral sent to Neurology, they will call the pt with appt  You have been scheduled for an endoscopy on 05/09/17. Please follow written instructions given to you at your visit today. If you use inhalers (even only as needed), please bring them with you on the day of your procedure. Your physician has requested that you go to www.startemmi.com and enter the access code given to you at your visit today. This web site gives a general overview about your procedure. However, you should still follow specific instructions given to you by our office regarding your preparation for the procedure.   Dr Joylene Draft scheduled  for a modified barium swallow on 04/11/17 at 1 pm. Please arrive 15 minutes prior to your test for registration. You will go to [facility] Radiology (1st Floor) for your appointment. Please refrain from eating or drinking anything 4 hours prior to your test. Should you need to cancel or reschedule your appointment, please contact [phone] [facility]) or [phone] [place]). _____________________________________________________________________ A Modified Barium Swallow Study, or MBS, is a special x-ray that is taken to check swallowing skills. It is carried out by a Stage manager and a Psychologist, clinical (SLP). During this test, yourmouth, throat, and esophagus, a muscular tube which connects your mouth to your stomach, is checked. The test will help you, your doctor, and the SLP plan what types of foods and liquids are easier for you to swallow. The SLP will also identify positions and ways to help you swallow more easily and safely. What will happen during an MBS? You will be taken to an x-ray room and seated comfortably. You will be asked to swallow small amounts of food and liquid mixed with barium. Barium is a liquid or paste that allows  images of your mouth, throat and esophagus to be seen on x-ray. The x-ray captures moving images of the food you are swallowing as it travels from your mouth through your throat and into your esophagus. This test helps identify whether food or liquid is entering your lungs (aspiration). The test also shows which part of your mouth or throat lacks strength or coordination to move the food or liquid in the right direction. This test typically takes 30 minutes to 1 hour to complete. _______________________________________________________________________    Electronically signed by Jeoffrey Massed, RN at 04/05/2017 10:18 AM

## 2017-04-07 ENCOUNTER — Telehealth: Payer: Self-pay | Admitting: Neurology

## 2017-04-07 NOTE — Telephone Encounter (Signed)
Pharmacy calling re: trihexyphenidyl (ARTANE) 2 MG tablet, she states they are out of 2mg  but they have 5mg  and would like a call to know if you would like pt switched temporarily to the 5 mg and have it cut in half, please call

## 2017-04-10 MED ORDER — TRIHEXYPHENIDYL HCL 5 MG PO TABS: 2.5000 mg | ORAL_TABLET | Freq: Two times a day (BID) | ORAL | 0 refills | Status: DC

## 2017-04-10 NOTE — Addendum Note (Signed)
Addended by: Star Age on: 04/10/2017 10:20 AM   Modules accepted: Orders

## 2017-04-10 NOTE — Telephone Encounter (Signed)
I placed an Rx for Artane 5 mg 1/2 pill bid until patient can fill the usual Rx for 2 mg tid. Sent to retail pharm on file, please inform pt.

## 2017-04-11 ENCOUNTER — Ambulatory Visit (HOSPITAL_COMMUNITY)
Admission: RE | Admit: 2017-04-11 | Discharge: 2017-04-11 | Disposition: A | Discharge status: Home / Self Care | Payer: Medicare Other | Source: Ambulatory Visit | Attending: Internal Medicine | Admitting: Internal Medicine

## 2017-04-11 DIAGNOSIS — R1314 Dysphagia, pharyngoesophageal phase: Secondary | ICD-10-CM | POA: Diagnosis not present

## 2017-04-11 DIAGNOSIS — R1310 Dysphagia, unspecified: Secondary | ICD-10-CM

## 2017-05-03 ENCOUNTER — Encounter: Payer: Self-pay | Admitting: Internal Medicine

## 2017-05-03 ENCOUNTER — Other Ambulatory Visit: Payer: Self-pay

## 2017-05-03 ENCOUNTER — Ambulatory Visit (AMBULATORY_SURGERY_CENTER): Payer: Self-pay | Admitting: *Deleted

## 2017-05-03 VITALS — Ht 72.5 in | Wt 186.0 lb

## 2017-05-03 DIAGNOSIS — R1310 Dysphagia, unspecified: Secondary | ICD-10-CM

## 2017-05-03 NOTE — Progress Notes (Signed)
No egg or soy allergy known to patient  No issues with past sedation with any surgeries  or procedures, no intubation problems  No diet pills per patient No home 02 use per patient  Takes  blood thinners per patient -pt is on Eliquis- pt is to remain on Eliquis per Dr Henrene Pastor for EGD  Pt denies issues with constipation  Pt has hx of  A flutter /A Fib-on Eliquis

## 2017-05-03 NOTE — Addendum Note (Signed)
Addended by: Steva Ready on: 05/03/2017 09:32 AM   Modules accepted: Level of Service

## 2017-05-06 ENCOUNTER — Other Ambulatory Visit: Payer: Self-pay | Admitting: Neurology

## 2017-05-08 ENCOUNTER — Encounter: Payer: Self-pay | Admitting: Internal Medicine

## 2017-05-09 ENCOUNTER — Other Ambulatory Visit: Payer: Self-pay

## 2017-05-09 ENCOUNTER — Other Ambulatory Visit: Payer: Self-pay | Admitting: Neurology

## 2017-05-09 ENCOUNTER — Encounter: Payer: Self-pay | Admitting: Internal Medicine

## 2017-05-09 ENCOUNTER — Ambulatory Visit (AMBULATORY_SURGERY_CENTER): Payer: Medicare Other | Admitting: Internal Medicine

## 2017-05-09 VITALS — BP 105/72 | HR 80 | Temp 96.9°F | Resp 16 | Ht 72.0 in | Wt 185.0 lb

## 2017-05-09 DIAGNOSIS — R1314 Dysphagia, pharyngoesophageal phase: Secondary | ICD-10-CM

## 2017-05-09 DIAGNOSIS — R9389 Abnormal findings on diagnostic imaging of other specified body structures: Secondary | ICD-10-CM

## 2017-05-09 DIAGNOSIS — R1310 Dysphagia, unspecified: Secondary | ICD-10-CM

## 2017-05-09 MED ORDER — SODIUM CHLORIDE 0.9 % IV SOLN: 500.0000 mL | Freq: Once | INTRAVENOUS | Status: DC

## 2017-05-09 NOTE — Op Note (Signed)
[facility] Patient Name: Kevin Potter Procedure Date: 05/09/2017 3:17 PM MRN: [id number] Endoscopist: Docia Chuck. Henrene Pastor , MD Age: 83 Referring MD:  Date of Birth: 11-27-1931 Gender: Male Account #: [id number] Procedure:                Upper GI endoscopy Indications:              Dysphagia. Incomplete esophagram. Aspiration.                            Abnormal evaluation with speech pathology.                            Neurology evaluation pending. Now for EGD to rule                            out obstructing lesion. Medicines:                Monitored Anesthesia Care Procedure:                Pre-Anesthesia Assessment:                           - Prior to the procedure, a History and Physical                            was performed, and patient medications and                            allergies were reviewed. The patient's tolerance of                            previous anesthesia was also reviewed. The risks                            and benefits of the procedure and the sedation                            options and risks were discussed with the patient.                            All questions were answered, and informed consent                            was obtained. Prior Anticoagulants: The patient has                            taken Eliquis (apixaban), last dose was day of                            procedure. ASA Grade Assessment: III - A patient                            with severe systemic disease. After reviewing the  risks and benefits, the patient was deemed in                            satisfactory condition to undergo the procedure.                           After obtaining informed consent, the endoscope was                            passed under direct vision. Throughout the                            procedure, the patient's blood pressure, pulse, and                            oxygen saturations were monitored  continuously. The                            Endoscope was introduced through the mouth, and                            advanced to the second part of duodenum. The upper                            GI endoscopy was accomplished without difficulty.                            The patient tolerated the procedure well. Scope In: Scope Out: Findings:                 The esophagus was normal.                           The stomach was normal, save benign-appearing small                            polyp midbody and I will.                           The examined duodenum was normal.                           The cardia and gastric fundus were normal on                            retroflexion. Complications:            No immediate complications. Estimated Blood Loss:     Estimated blood loss: none. Impression:               - Essentially normal EGD.No obstructing lesions of                            the esophagus Recommendation:           - Patient has a contact number available for  emergencies. The signs and symptoms of potential                            delayed complications were discussed with the                            patient. Return to normal activities tomorrow.                            Written discharge instructions were provided to the                            patient.                           - Resume previous diet, Including modifications as                            directed by speech pathology.                           - Continue present medications.                           - Keep plans for neurology evaluation as already                            arranged                           - Return to Dr. Everlean Alstrom. Henrene Pastor, MD 05/09/2017 3:35:50 PM This report has been signed electronically.

## 2017-05-09 NOTE — Progress Notes (Signed)
Pt's states no medical or surgical changes since previsit or office visit. 

## 2017-05-09 NOTE — Patient Instructions (Signed)
YOU HAD AN ENDOSCOPIC PROCEDURE TODAY AT [facility]:   Refer to the procedure report that was given to you for any specific questions about what was found during the examination.  If the procedure report does not answer your questions, please call your gastroenterologist to clarify.  If you requested that your care partner not be given the details of your procedure findings, then the procedure report has been included in a sealed envelope for you to review at your convenience later.  YOU SHOULD EXPECT: Some feelings of bloating in the abdomen. Passage of more gas than usual.  Walking can help get rid of the air that was put into your GI tract during the procedure and reduce the bloating. If you had a lower endoscopy (such as a colonoscopy or flexible sigmoidoscopy) you may notice spotting of blood in your stool or on the toilet paper. If you underwent a bowel prep for your procedure, you may not have a normal bowel movement for a few days.  Please Note:  You might notice some irritation and congestion in your nose or some drainage.  This is from the oxygen used during your procedure.  There is no need for concern and it should clear up in a day or so.  SYMPTOMS TO REPORT IMMEDIATELY:   Following upper endoscopy (EGD)  Vomiting of blood or coffee ground material  New chest pain or pain under the shoulder blades  Painful or persistently difficult swallowing  New shortness of breath  Fever of 100F or higher  Black, tarry-looking stools  For urgent or emergent issues, a gastroenterologist can be reached at any hour by calling [phone].   DIET:  Resume previous diet including modifications as directed by speech pathology. We do recommend a small meal at first, but then you may proceed to your regular diet.  Drink plenty of fluids but you should avoid alcoholic beverages for 24 hours.  MEDICATIONS: Continue present medications.  Please see handouts given to you by your  recovery nurse.  FOLLOW-UP: Keep plans for neurology evaluation as already arranged. Return to Dr. Joylene Draft.  ACTIVITY:  You should plan to take it easy for the rest of today and you should NOT DRIVE or use heavy machinery until tomorrow (because of the sedation medicines used during the test).    FOLLOW UP: Our staff will call the number listed on your records the next business day following your procedure to check on you and address any questions or concerns that you may have regarding the information given to you following your procedure. If we do not reach you, we will leave a message.  However, if you are feeling well and you are not experiencing any problems, there is no need to return our call.  We will assume that you have returned to your regular daily activities without incident.  If any biopsies were taken you will be contacted by phone or by letter within the next 1-3 weeks.  Please call us at [phone] if you have not heard about the biopsies in 3 weeks.   Thank you for allowing [place] to provide for your healthcare needs today.  SIGNATURES/CONFIDENTIALITY: You and/or your care partner have signed paperwork which will be entered into your electronic medical record.  These signatures attest to the fact that that the information above on your After Visit Summary has been reviewed and is understood.  Full responsibility of the confidentiality of this discharge information lies with you and/or your care-partner.

## 2017-05-09 NOTE — Progress Notes (Signed)
A/ox3 pleased with MAC, report to [place]

## 2017-05-10 ENCOUNTER — Telehealth: Payer: Self-pay | Admitting: Neurology

## 2017-05-10 ENCOUNTER — Telehealth: Payer: Self-pay | Admitting: *Deleted

## 2017-05-10 MED ORDER — TRIHEXYPHENIDYL HCL 5 MG PO TABS: 2.5000 mg | ORAL_TABLET | Freq: Two times a day (BID) | ORAL | 0 refills | Status: DC

## 2017-05-10 NOTE — Addendum Note (Signed)
Addended by: Lester [place] A on: 05/10/2017 03:56 PM   Modules accepted: Orders

## 2017-05-10 NOTE — Telephone Encounter (Signed)
PA for artane completed and sent to [place].

## 2017-05-10 NOTE — Telephone Encounter (Signed)
[facility] [place] says [place] is needed for trihexyphenidyl (ARTANE) 2 MG tablet.

## 2017-05-10 NOTE — Telephone Encounter (Signed)
  Follow up Call-  Call back number 05/09/2017  Post procedure Call Back phone  # [phone]  Permission to leave phone message Yes  Some recent data might be hidden     Patient questions:  Do you have a fever, pain , or abdominal swelling? No. Pain Score  0 *  Have you tolerated food without any problems? Yes.    Have you been able to return to your normal activities? Yes.    Do you have any questions about your discharge instructions: Diet   No. Medications  No. Follow up visit  No.  Do you have questions or concerns about your Care? No.  Actions: * If pain score is 4 or above: No action needed, pain <4.

## 2017-05-10 NOTE — Telephone Encounter (Signed)
[facility] advised me that a pa is not needed for artane. I called [facility]. The artane 2mg  tablet is still unavailable and they need a refill for the 5mg  tablet. Will refill.  I called pt and explained that for this month, he will still need to take the artane 5mg  tablet 1/2 tablet BID, instead of the usually 2mg  TID. Pt is agreeable to this and verbalized understanding.

## 2017-05-19 ENCOUNTER — Ambulatory Visit: Payer: Medicare Other | Admitting: Internal Medicine

## 2017-05-24 ENCOUNTER — Ambulatory Visit: Payer: Medicare Other | Admitting: Neurology

## 2017-05-24 ENCOUNTER — Encounter: Payer: Self-pay | Admitting: Neurology

## 2017-05-24 VITALS — BP 136/72 | HR 63 | Ht 73.0 in | Wt 189.0 lb

## 2017-05-24 DIAGNOSIS — G252 Other specified forms of tremor: Secondary | ICD-10-CM | POA: Diagnosis not present

## 2017-05-24 DIAGNOSIS — G243 Spasmodic torticollis: Secondary | ICD-10-CM

## 2017-05-24 MED ORDER — TRIHEXYPHENIDYL HCL 5 MG PO TABS: 2.5000 mg | ORAL_TABLET | Freq: Three times a day (TID) | ORAL | 3 refills | Status: DC

## 2017-05-24 NOTE — Patient Instructions (Addendum)
We can try to increase your Artane to 5 mg 1/2 three times a day from twice daily, if tolerated. Remember, side effects include (but are not limited by): mouth dryness, bladder dysfunction, dizziness, blurry vision, upset stomach; rare side effects include (but are not limited by): irregular or slow hear beat, anxiety, hallucinations, confusion, agitation, hyperactivity, nervousness.    My biggest concern is your fall risk.   Please hydrate better with water, walk slowly, use your cane for safety.

## 2017-05-24 NOTE — Progress Notes (Signed)
Subjective:    Patient ID: Kevin Potter is a 83 y.o. male.  HPI     Interim history:   Kevin Potter is a very pleasant 83 year old right-handed gentleman with an underlying medical history of heart disease, obstructive sleep apnea not on CPAP, hypothyroidism, hypertension, erectile dysfunction, first-degree AV block, gallstones, hiatal hernia, atrial flutter diagnosed in 2007, BPH, allergic rhinitis, low back pain, and osteoarthritis, who presents for follow-up consultation of his head tremor (dystonic vs. ET related) and hand tremor. He is unaccompanied today. I last saw him on 06/01/2016, at which time he reported right hand tremor. He had some low back pain. His primary care physician provided a muscle relaxer which helped. He had noticed some balance issues. He was not hydrating well enough. We talked about gait safety and better hydration. I suggested he continue with Artane low-dose.  We had to switch in the interim his Artane dose to 5 mg strength as his pharmacy no longer carried the 2 mg strength.  Today, 05/24/2017: He reports that R hand tremor is worse, affecting handwriting and feeding skills. Of note presented to the emergency room on 02/05/2017 after a fall. He sustained left-sided rib fractures. His chest x-ray and rib x-rays from 02/05/2017 also showed small pleural effusion and likely basilar atelectasis. No evidence of pneumothorax.  He has been taking Artane 5 mg 1/2 bid. He feels that his balance has become worse with time. He has a cane but forgets to use it. He may not always hydrate well enough he admits.   Previously:   I saw him on 04/13/15, at which time he reported doing well, no new changes from the tremor standpoint, felt stable. He had some balance issues, reporting 2 falls some months ago: had stepped off a curb and fell on his back and one time he fell on uneven ground, no serious injuries, thankfully. He felt stable on Artane, we mutually agreed to continue with it 3  times a day.     I saw him on 04/11/2014, at which time he reported doing well overall, but balance was a little worse. He was on Eliquis per cardiology. He was on Artane 2 mg 3 times a day. I kept him on the same dose of Artane. He was advised to start using a cane for safety.   I first met him on 04/11/2013 at the request of his primary care physician. At the time I suggested he continue with Artane at the current dose.    He was originally diagnosed with essential tremor several years ago by his internist. 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 He previously saw Dr. Morene Antu and was last seen by him in 2012, and on 03/06/2012 he was seen by our nurse practitioner Ms. Hassell Done. He was deemed stable at the time of his last appointment and was kept on the same medication. He has been taking Artane 2 mg 3 times a day which helps his hand and head tremor. He had some blood work in August of this year which I reviewed: His TSH was low at 0.04, free T4 was normal and total T3 was also in the normal range, PSA was mildly elevated at 4.811. He has been on Artane for several years. He has not been on Mysoline and has been on propranolol for years. He has noted no problems with fine motor skills.   Several years ago, he had Botox injections into the neck, but he did not like  it, as the injections were painful and he did not see any benefit.  The patient's allergies, current medications, family history, past medical history, past social history, past surgical history and problem list were reviewed and updated as appropriate.    His Past Medical History Is Significant For: Past Medical History:  Diagnosis Date  . 1st degree AV block   . Allergy   . Arthritis    right foot ,   . Atrial flutter ([place])   . BPH (benign prostatic hyperplasia)   . Chicken pox   . Chronic anticoagulation   . Colon polyps   . Colonic adenoma   . Diverticulosis   . Erectile dysfunction   . Gallstones   .  GERD (gastroesophageal reflux disease)   . Gilbert's syndrome   . Goiter   . Hemorrhoids   . Hiatal hernia   . Hypertension   . Hyperthyroidism   . Hypothyroidism   . Sleep apnea    no cpap now- lost weight   . Tremor     His Past Surgical History Is Significant For: Past Surgical History:  Procedure Laterality Date  . APPENDECTOMY    . CARDIOVERSION  03/15/2007   elective DC/x1 with joint pads in the anterior & posterior position/no complications  . COLONOSCOPY    . left ankle surgery    . NASAL SEPTUM SURGERY    . PALATE / UVULA BIOPSY / EXCISION    . POLYPECTOMY    . TONSILLECTOMY     with uvula removed  . UVA      His Family History Is Significant For: Family History  Problem Relation Age of Onset  . Heart disease Father   . Cancer Mother        unknown type  . Heart disease Mother   . Cancer Sister        unknown type  . Heart disease Brother   . Colon cancer Neg Hx   . Colon polyps Neg Hx   . Esophageal cancer Neg Hx   . Rectal cancer Neg Hx   . Stomach cancer Neg Hx     His Social History Is Significant For: Social History   Socioeconomic History  . Marital status: Married    Spouse name: None  . Number of children: 2  . Years of education: None  . Highest education level: None  Social Needs  . Financial resource strain: None  . Food insecurity - worry: None  . Food insecurity - inability: None  . Transportation needs - medical: None  . Transportation needs - non-medical: None  Occupational History  . Occupation: Network engineer  Tobacco Use  . Smoking status: Never Smoker  . Smokeless tobacco: Never Used  Substance and Sexual Activity  . Alcohol use: Yes    Alcohol/week: 0.0 oz    Comment: red wine at night  . Drug use: No  . Sexual activity: Not Currently  Other Topics Concern  . None  Social History Narrative  . None    His Allergies Are:  No Known Allergies:   His Current Medications Are:  Outpatient Encounter Medications  as of 05/24/2017  Medication Sig  . ELIQUIS 5 MG TABS tablet TAKE ONE TABLET BY MOUTH TWICE A DAY  . fluticasone (FLONASE) 50 MCG/ACT nasal spray Place 1 spray into the nose as needed.   Marland Kitchen LORazepam (ATIVAN) 1 MG tablet Take 1 mg by mouth daily.    . NONFORMULARY OR COMPOUNDED [place]  Anti-Inflammatory  Cream- Diclofenac 3%, Baclofen 2%, Lidocaine 2% Apply 1-2 grams to affected area 3-4 times daily Qty. 120 gm 3 refills  . omeprazole (PRILOSEC) 40 MG capsule Take 1 capsule (40 mg total) by mouth 2 (two) times daily. (Patient taking differently: Take 80 mg by mouth 2 (two) times daily. )  . propranolol (INDERAL) 20 MG tablet TAKE 1 TABLET BY MOUTH THREE TIMES DAILY  . tamsulosin (FLOMAX) 0.4 MG CAPS capsule   . traMADol (ULTRAM) 50 MG tablet Take 1 tablet (50 mg total) by mouth every 6 (six) hours as needed.  . trihexyphenidyl (ARTANE) 5 MG tablet Take 0.5 tablets (2.5 mg total) by mouth 2 (two) times daily with a meal.  . trihexyphenidyl (ARTANE) 2 MG tablet Take 1 tablet (2 mg total) by mouth 3 (three) times daily with meals. (Patient not taking: Reported on 05/24/2017)   Facility-Administered Encounter Medications as of 05/24/2017  Medication  . 0.9 %  sodium chloride infusion  :  Review of Systems:  Out of a complete 14 point review of systems, all are reviewed and negative with the exception of these symptoms as listed below:  Review of Systems  Neurological:       Pt presents today to discuss his tremor. Pt has noticed an increase in his R hand tremor, and he is having difficulty writing and eating. Pt's pharmacy does not have artane 56m in stock so he has continued with the artane 579mand breaking it in half.    Objective:  Neurological Exam  Physical Exam Physical Examination:   Vitals:   05/24/17 1044  BP: 136/72  Pulse: 63   General Examination: The patient is a very pleasant [age].o. male in no acute distress. He appears well-developed and  well-nourished and well groomed.   HEENT: Normocephalic, atraumatic, pupils are equal, round and reactive to light and accommodation. Extraocular tracking is good without limitation to gaze excursion or nystagmus noted. Normal smooth pursuit is noted. Hearing is grossly intact. Face is symmetric with normal facial animation and normal facial sensation. Speech is clear with no dysarthria noted. There is mild hypophonia. There is a mild, constant, side-to-side head tremor. Neck is moderately rigid with decrease in active and passive range of mobility. He has evidence of mild anterocollis, R torticollis, L laterocollis, L shoulder higher than R. He has no voice tremor. Oropharynx exam reveals: mild mouth dryness, adequate dental hygiene and moderate airway crowding, due to larger tongue and narrow airway. He is s/p uvulectomy and TE. Mallampati is class II. Tongue protrudes centrally and palate elevates symmetrically.   Chest: Clear to auscultation without wheezing, rhonchi or crackles noted.  Heart: S1+S2+0, regular and normal without murmurs, rubs or gallops noted.   Abdomen: Soft, non-tender and non-distended with normal bowel sounds appreciated on auscultation.  Extremities: There is trace to 1+ pitting edema in the R ankle.   Skin: Warm and dry without trophic changes noted. There are no varicose veins.  Musculoskeletal: exam reveals no obvious joint deformities, tenderness or joint swelling or erythema.   Neurologically:  Mental status: The patient is awake, alert and oriented in all 4 spheres. His memory, attention, language and knowledge are appropriate. There is no aphasia, agnosia, apraxia or anomia. Speech is clear with normal prosody and enunciation. Thought process is linear. Mood is congruent and affect is normal.  Cranial nerves are as described above under HEENT exam. In addition, shoulder shrug is normal with unequal shoulder height noted, with a left shoulder elevation.  Motor exam: Normal bulk, strength and tone is noted. There is no drift, or rebound. There is no resting tremor. There is a minimal right upper extremity postural, minimal action tremor.  Reflexes are 1+ throughout. Fine motor skills are intact with normal finger taps, normal hand movements, normal rapid alternating patting, normal foot taps and normal foot agility.  Cerebellar testing shows no dysmetria or intention tremor on finger to nose testing. Sensory exam is intact to light touch in the upper and lower extremities.  Gait, station and balance: He stands with mild difficulty and pushes himself up. His stance is slightly wide-based. Romberg is not tested for safety reasons. He walks slightly insecurely. He has some difficulty with turns, R limp.   Assessment and Plan:    In summary, Kevin Potter is a very pleasant 83 year old male with an underlying medical history of heart disease, obstructive sleep apnea, s/p surgery in the distant past, hypothyroidism, goiter, hypertension, erectile dysfunction, first-degree AV block, gallstones, hiatal hernia, atrial flutter diagnosed in 2007, BPH, allergic rhinitis, low back pain, and osteoarthritis, who presents for follow-up consultation of his head tremor and R hand tremor. His physical exam is fairly stable, right hand tremor a little worse perhaps. Cervical dystonia slightly worse as well, had tremor stable. He has had balance issues and has fallen. Most recently he fell and broke ribs on the left side. I worry about his fall risk more than anything else. He is advised to try to push the Artane 5 mg strength to half a pill 3 times a day but I am reluctant to try or add anything new for fear of side effects. When we had to switch him from Artane 2 mg 3 times a day to 5 mg strength we actually reduce the total dose from 6 mg to 5 mg. We can certainly try to add a half pill midday so long as he can tolerate it. He is strongly encouraged to use his cane for gait  safety and to stay well hydrated with water. I adjusted his Artane prescription and suggested a routine one-year checkup, sooner as needed. I answered all his questions today and he was in agreement.  I spent 25 minutes in total face-to-face time with the patient, more than 50% of which was spent in counseling and coordination of care, reviewing test results, reviewing medication and discussing or reviewing the diagnosis of CD and tremor, the prognosis and treatment options. Pertinent laboratory and imaging test results that were available during this visit with the patient were reviewed by me and considered in my medical decision making (see chart for details).

## 2017-05-27 IMAGING — MR MR LUMBAR SPINE W/O CM
4 of 5 series · 25 of 48 positions shown · non-contrast
Comparison: None.

CLINICAL DATA: Chronic low back pain. Pain in the feet and weakness
in the left leg.

EXAM:
MRI LUMBAR SPINE WITHOUT CONTRAST
TECHNIQUE: Multiplanar, multisequence MR imaging of the lumbar spine was
performed. No intravenous contrast was administered.

[Series 3: T2 · sagittal · 4.0mm · 0.55mm/px · 6 of 13 slices shown (1 of 2)]
[im 1/13]
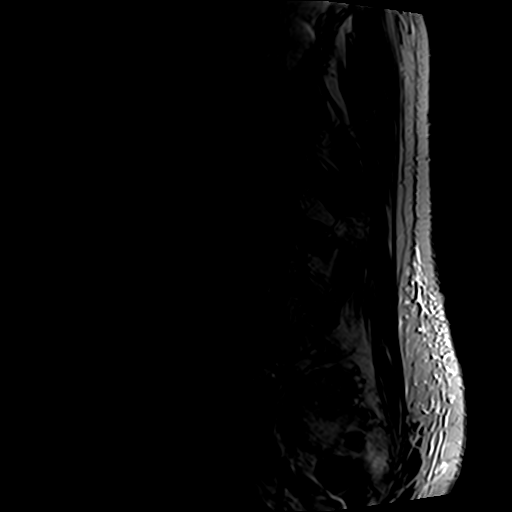
[im 3/13]
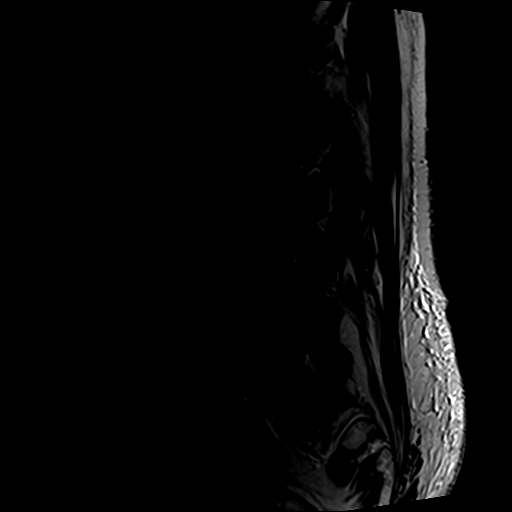
[im 5/13]
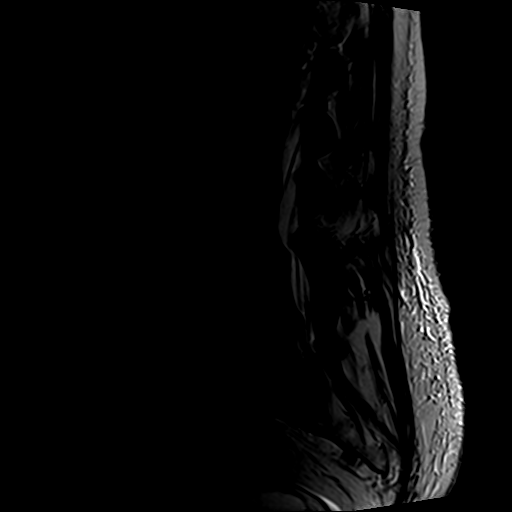
[im 8/13]
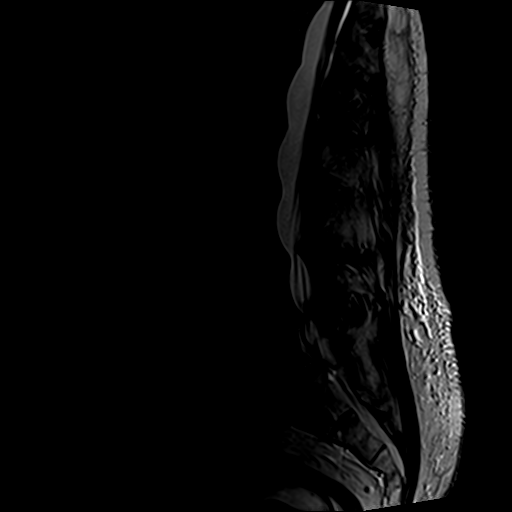
[im 10/13]
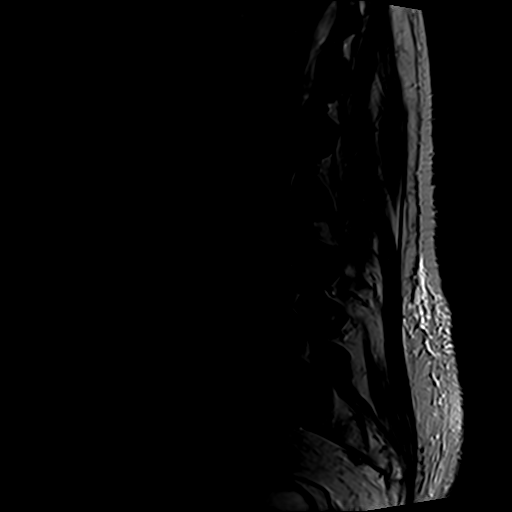
[im 13/13]
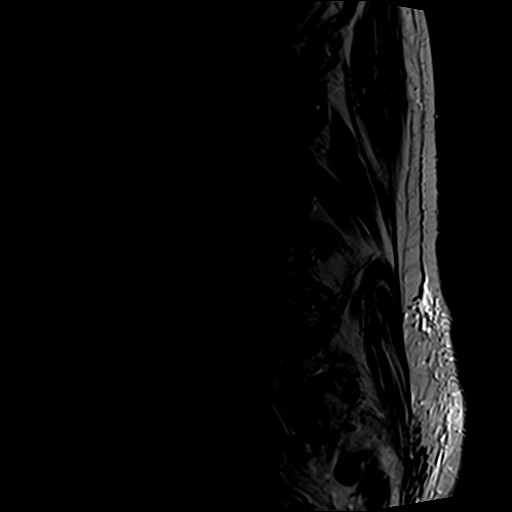

[Series 5: T1 · sagittal · 4.0mm · 0.55mm/px · 6 of 13 slices shown (1 of 2)]
[im 1/13]
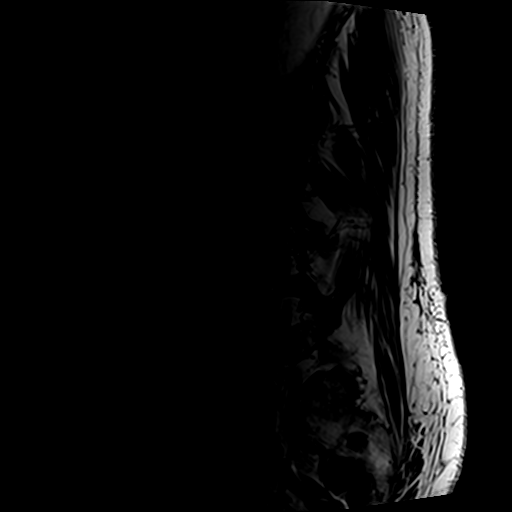
[im 3/13]
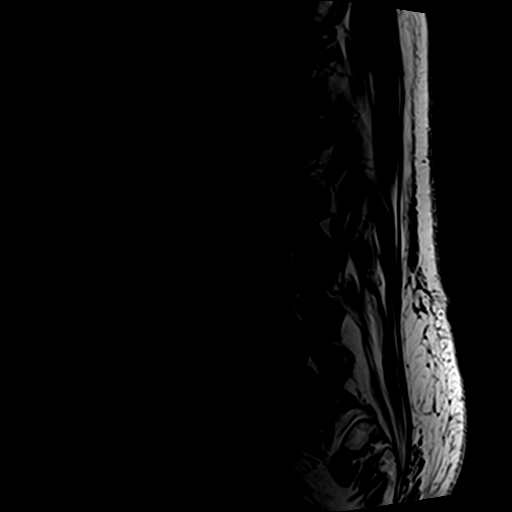
[im 5/13]
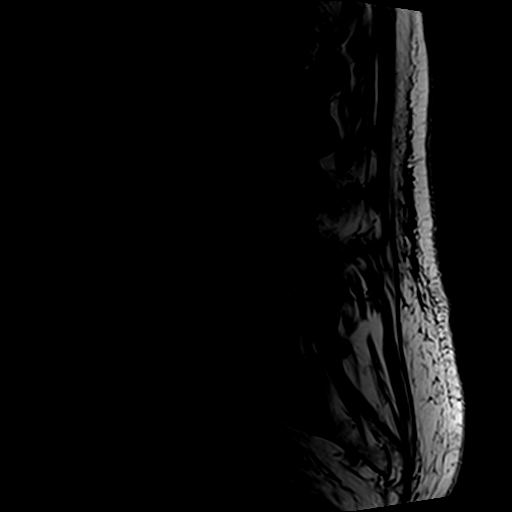
[im 8/13]
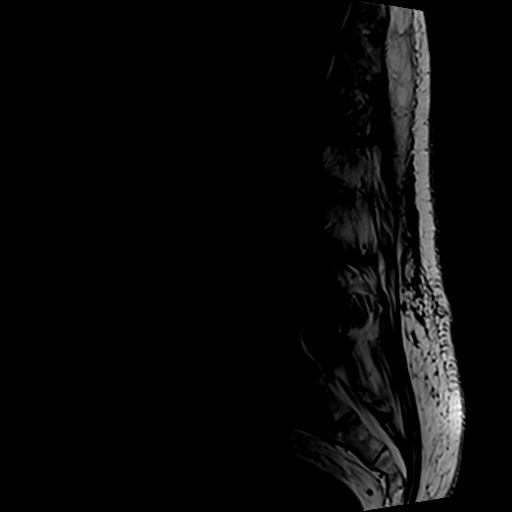
[im 10/13]
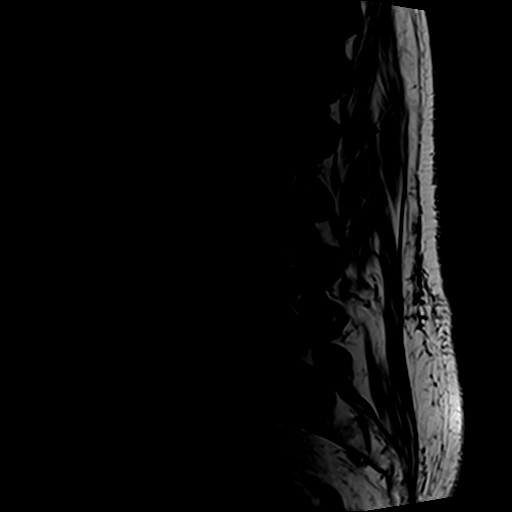
[im 13/13]
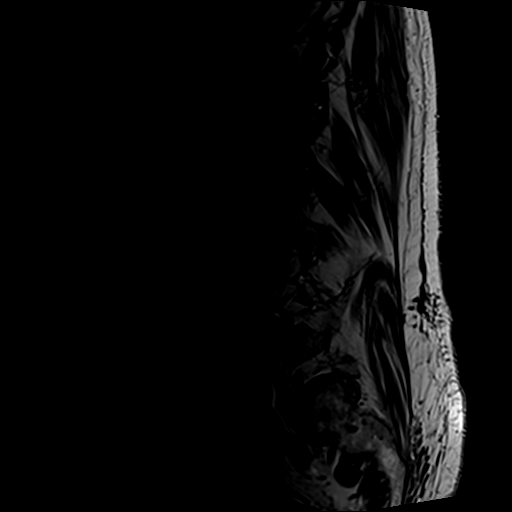

[Series 6: T2 · axial · 4.0mm · 0.70mm/px · z∈[-84,+107]mm · 9 of 34 slices shown (2 of 2)]
[im 1/34]
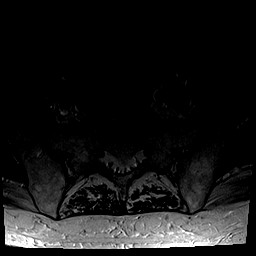
[im 5/34]
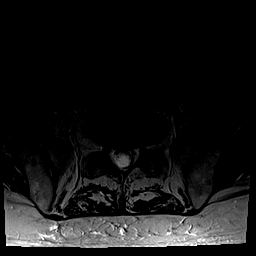
[im 10/34]
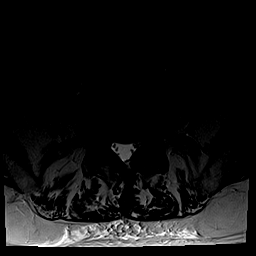
[im 15/34]
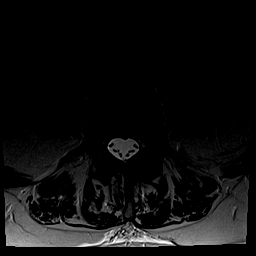
[im 17/34]
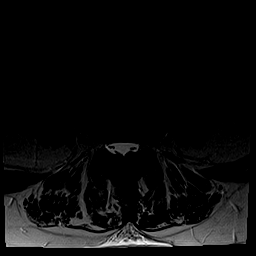
[im 19/34]
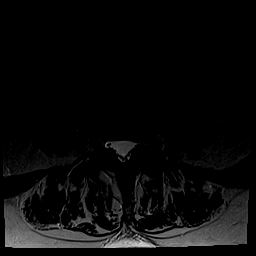
[im 24/34]
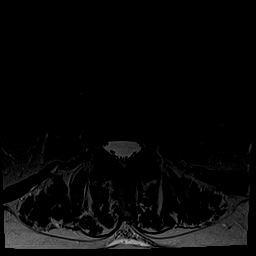
[im 29/34]
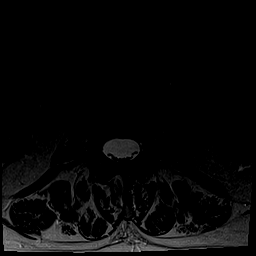
[im 34/34]
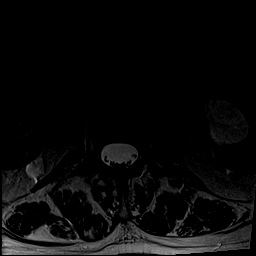

[Series 7: T1 · axial · 4.0mm · 0.35mm/px · z∈[-84,+81]mm · 4 of 34 slices shown (2 of 2)]
[im 1/34]
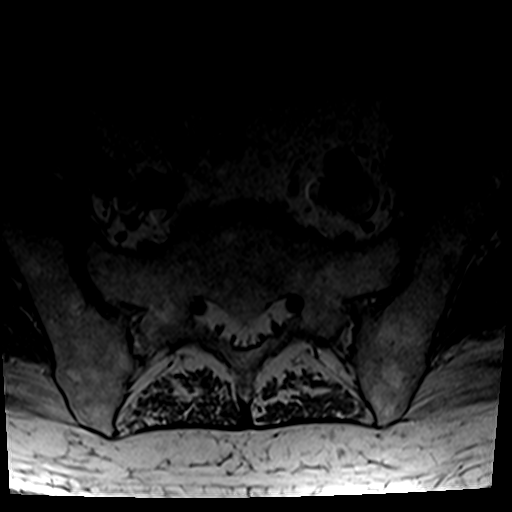
[im 5/34]
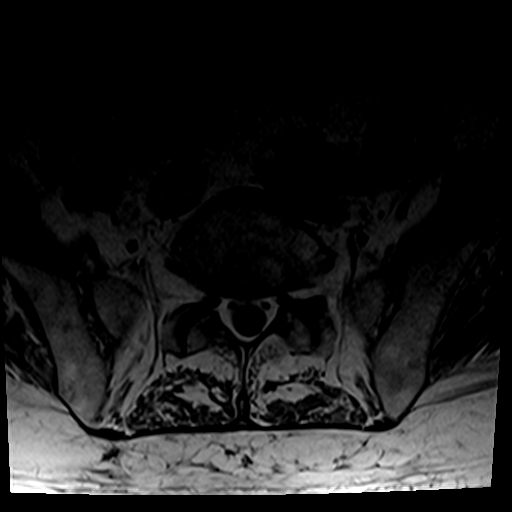
[im 17/34]
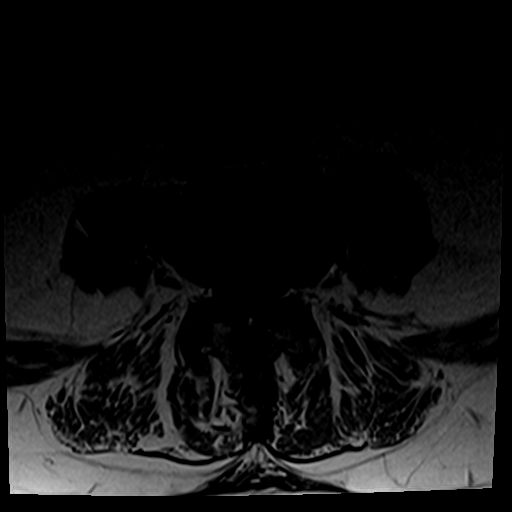
[im 29/34]
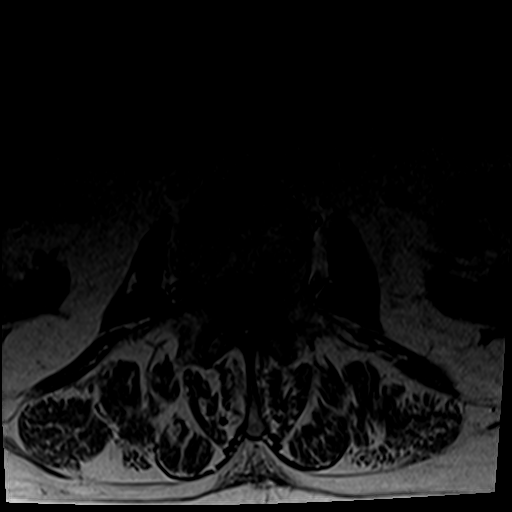

[25 of 48 positions shown; findings below may reference images not displayed]

FINDINGS: Segmentation: Normal lumbar segmentation is assumed, with the lowest
fully formed disc space designated L5-S1.

Alignment:  Normal.

Vertebrae: No evidence of fracture, destructive osseous lesion, or
significant marrow edema.

Conus medullaris: Extends to the T12-L1 level and appears normal.

Paraspinal and other soft tissues: Partially visualized T2
hyperintense renal lesions bilaterally, likely cysts but
incompletely evaluated.

Disc levels:

Diffuse lumbar disc desiccation. Disc space narrowing is moderate at
L4-5 and mild at L5-S1.

L1-2:  Mild facet arthrosis without disc herniation or stenosis.

L2-3: Suspected calcification of the disc with interbody ankylosis
anteriorly. Mild facet hypertrophy without disc herniation or
stenosis. Possible partial facet ankylosis bilaterally.

L3-4: Ligamentum flavum thickening and severe facet arthrosis result
in minimal right and mild left lateral recess stenosis and
borderline spinal stenosis. No neural foraminal stenosis.

L4-5: Mild disc bulging and moderate facet hypertrophy result in
moderate bilateral lateral recess stenosis potentially affecting the
L5 nerve roots. No significant spinal or neural foraminal stenosis.

L5-S1:  Mild facet arthrosis without disc herniation or stenosis.
IMPRESSION: 1. Moderate disc and facet degeneration at L4-5 with moderate
bilateral lateral recess stenosis.
2. Advanced facet arthrosis at L3-4 with mild left lateral recess
stenosis.

## 2017-06-06 ENCOUNTER — Other Ambulatory Visit: Payer: Self-pay | Admitting: Neurology

## 2017-06-27 ENCOUNTER — Other Ambulatory Visit: Payer: Self-pay | Admitting: Internal Medicine

## 2017-06-27 DIAGNOSIS — E049 Nontoxic goiter, unspecified: Secondary | ICD-10-CM

## 2017-07-01 ENCOUNTER — Other Ambulatory Visit: Payer: Self-pay | Admitting: Neurology

## 2017-07-04 ENCOUNTER — Other Ambulatory Visit: Payer: Self-pay | Admitting: Cardiology

## 2017-07-05 ENCOUNTER — Other Ambulatory Visit: Payer: Medicare Other

## 2017-07-12 ENCOUNTER — Inpatient Hospital Stay
Admission: RE | Admit: 2017-07-12 | Discharge: 2017-07-12 | Disposition: A | Discharge status: Home / Self Care | Payer: Medicare Other | Source: Ambulatory Visit | Attending: Internal Medicine | Admitting: Internal Medicine

## 2017-08-03 ENCOUNTER — Ambulatory Visit
Admission: RE | Admit: 2017-08-03 | Discharge: 2017-08-03 | Disposition: A | Discharge status: Home / Self Care | Payer: Medicare Other | Source: Ambulatory Visit | Attending: Internal Medicine | Admitting: Internal Medicine

## 2017-08-03 ENCOUNTER — Other Ambulatory Visit: Payer: Self-pay | Admitting: Cardiology

## 2017-08-03 DIAGNOSIS — E049 Nontoxic goiter, unspecified: Secondary | ICD-10-CM

## 2017-08-03 MED ORDER — IOPAMIDOL (ISOVUE-300) INJECTION 61%
75.0000 mL | Freq: Once | INTRAVENOUS | Status: AC | PRN
  Administered 2017-08-03: 75 mL via INTRAVENOUS

## 2017-08-09 ENCOUNTER — Telehealth: Payer: Self-pay | Admitting: Neurology

## 2017-08-09 NOTE — Telephone Encounter (Signed)
Pt called stating [employer] told him to requesting Sinemet stating many pts have responded well to medication, Pt is requesting a call back to discuss the option.

## 2017-08-09 NOTE — Telephone Encounter (Signed)
I called pt. Pt's wife answered, and I asked pt's wife to have pt call me back.  Dr. Guadelupe Sabin last note with pt says that she is reluctant to try anything else with the pt due to side effects. I called pt to advise him of this. If pt is still interested in starting a new med, I would recommend a follow up appt with Dr. Rexene Alberts to discuss.

## 2017-08-09 NOTE — Telephone Encounter (Signed)
Pt returned my call. I explained to him that per Dr. Guadelupe Sabin note with him from December, she is reluctant to try anything else with the pt due to risk of side effects. Pt is insistent that [employer] recommended sinemet for him. Pt reports that the artane is not working. I offered pt an appt with Hoyle Sauer, NP next week to discuss further. I also explained possible side effects of sinemet. Pt refused an appt with Hoyle Sauer, NP (citing the copay as the reason he does not want to come back in) and wants me to discuss this with Dr. Rexene Alberts when she returns to the office and call him back.

## 2017-08-14 NOTE — Telephone Encounter (Signed)
I called pt and explained Dr. Guadelupe Sabin recommendation. Pt does not want to see another neurologist. Pt says that he will follow up with Dr. Rexene Alberts as scheduled.

## 2017-08-14 NOTE — Telephone Encounter (Signed)
My best recommendation is that he consider another opinion with a neurologist locally, like at [place] or at [facility] or [place] or [facility]. I would encourage pt to discuss this with his PCP and go from there. I am not comfortable with trying Sinemet in his case, but someone else may very well be.

## 2017-08-18 ENCOUNTER — Other Ambulatory Visit: Payer: Self-pay | Admitting: Cardiology

## 2017-08-21 ENCOUNTER — Telehealth: Payer: Self-pay | Admitting: Cardiology

## 2017-08-21 NOTE — Telephone Encounter (Signed)
New Message   *STAT* If patient is at the pharmacy, call can be transferred to refill team.   1. Which medications need to be refilled? (please list name of each medication and dose if known) apixaban (ELIQUIS) 5 MG TABS tablet  2. Which pharmacy/location (including street and city if local pharmacy) is medication to be sent to? [facility] [address], [address]  3. Do they need a 30 day or 90 day supply? [place]

## 2017-08-22 MED ORDER — APIXABAN 5 MG PO TABS: 5.0000 mg | ORAL_TABLET | Freq: Two times a day (BID) | ORAL | 0 refills | Status: DC

## 2017-09-11 ENCOUNTER — Other Ambulatory Visit: Payer: Self-pay | Admitting: Neurology

## 2017-09-11 DIAGNOSIS — R2689 Other abnormalities of gait and mobility: Secondary | ICD-10-CM

## 2017-09-11 DIAGNOSIS — G252 Other specified forms of tremor: Secondary | ICD-10-CM

## 2017-09-11 DIAGNOSIS — R251 Tremor, unspecified: Secondary | ICD-10-CM

## 2017-09-11 NOTE — Telephone Encounter (Signed)
RX for artane 2mg  TID sent to [facility].

## 2017-09-11 NOTE — Telephone Encounter (Signed)
Patient called back and stated he would like a Rx for Artane 2mg  called to [facility] so he will not have to break pill  in half.

## 2017-09-11 NOTE — Telephone Encounter (Signed)
I called Kevin Potter, the 2mg  tablet of artane is now available.  I called pt to find out if he would like to switch back to artane 2mg  instead of the artane 5mg  tablets. No answer, left a message asking him to call me back.

## 2017-09-27 ENCOUNTER — Encounter: Payer: Self-pay | Admitting: Cardiology

## 2017-10-08 NOTE — Progress Notes (Signed)
Kevin Potter Date of Birth: Jul 08, 1931 Medical Record #[id number]  History of Present Illness: Kevin Potter is seen for follow up of atrial flutter.  Last seen in August 2016. He has chronic atrial flutter. Intolerant to Rythmol and failed on past cardioversion back in 2008. In 2008 he did have a Myoview study which showed no ischemia and an ejection fraction of 47%. Echocardiogram at that time also showed an ejection fraction of 45-50%. He is managed with rate control and anticoagulation. He is on Eliquis.  Repeat Echo in July 2014 showed normal LV function with EF 55-65%.   On follow up today his health has deteriorated in the last year. He had a fall and broke some ribs. Since then he has more problems with balance. He is followed by Neurology for a tremor.  He does have a large goiter that is being followed. His activity has declined significantly- previously able to walk 40 minutes daily but now has difficulty with ADLs. He has a lot of swelling in his legs R>L. He usually tries to wear compression hose. He does note " congestion" in his chest that seems better this past month.   Current Outpatient Medications on File Prior to Visit  Medication Sig Dispense Refill  . apixaban (ELIQUIS) 5 MG TABS tablet Take 1 tablet (5 mg total) by mouth 2 (two) times daily. 180 tablet 0  . fluticasone (FLONASE) 50 MCG/ACT nasal spray Place 1 spray into the nose as needed.     Marland Kitchen LORazepam (ATIVAN) 1 MG tablet Take 1 mg by mouth daily.      . NONFORMULARY OR COMPOUNDED [facility]  Anti-Inflammatory Cream- Diclofenac 3%, Baclofen 2%, Lidocaine 2% Apply 1-2 grams to affected area 3-4 times daily Qty. 120 gm 3 refills 1 each 0  . omeprazole (PRILOSEC) 40 MG capsule Take 1 capsule (40 mg total) by mouth 2 (two) times daily. (Patient taking differently: Take 80 mg by mouth 2 (two) times daily. ) 60 capsule 3  . propranolol (INDERAL) 20 MG tablet TAKE 1 TABLET BY MOUTH THREE TIMES DAILY 90 tablet 10   . tamsulosin (FLOMAX) 0.4 MG CAPS capsule     . traMADol (ULTRAM) 50 MG tablet Take 1 tablet (50 mg total) by mouth every 6 (six) hours as needed. 20 tablet 0  . trihexyphenidyl (ARTANE) 2 MG tablet TAKE ONE TABLET BY MOUTH THREE TIMES A DAY WITH MEALS 270 tablet 0   Current Facility-Administered Medications on File Prior to Visit  Medication Dose Route Frequency Provider Last Rate Last Dose  . 0.9 %  sodium chloride infusion  500 mL Intravenous Once Irene Shipper, MD        No Known Allergies  Past Medical History:  Diagnosis Date  . 1st degree AV block   . Allergy   . Arthritis    right foot ,   . Atrial flutter ([place])   . BPH (benign prostatic hyperplasia)   . Chicken pox   . Chronic anticoagulation   . Colon polyps   . Colonic adenoma   . Diverticulosis   . Erectile dysfunction   . Gallstones   . GERD (gastroesophageal reflux disease)   . Gilbert's syndrome   . Goiter   . Hemorrhoids   . Hiatal hernia   . Hypertension   . Hyperthyroidism   . Hypothyroidism   . Sleep apnea    no cpap now- lost weight   . Tremor     Past Surgical History:  Procedure Laterality Date  . APPENDECTOMY    . CARDIOVERSION  03/15/2007   elective DC/x1 with joint pads in the anterior & posterior position/no complications  . COLONOSCOPY    . left ankle surgery    . NASAL SEPTUM SURGERY    . PALATE / UVULA BIOPSY / EXCISION    . POLYPECTOMY    . TONSILLECTOMY     with uvula removed  . UVA      Social History   Tobacco Use  Smoking Status Never Smoker  Smokeless Tobacco Never Used    Social History   Substance and Sexual Activity  Alcohol Use Yes  . Alcohol/week: 0.0 oz   Comment: red wine at night    Family History  Problem Relation Age of Onset  . Heart disease Father   . Cancer Mother        unknown type  . Heart disease Mother   . Cancer Sister        unknown type  . Heart disease Brother   . Colon cancer Neg Hx   . Colon polyps Neg Hx   . Esophageal cancer  Neg Hx   . Rectal cancer Neg Hx   . Stomach cancer Neg Hx     Review of Systems: The review of systems is per the HPI.  All other systems were reviewed and are negative.  Physical Exam: BP (!) 142/74 (BP Location: Left Arm, Patient Position: Sitting, Cuff Size: Normal)   Pulse 64   Ht 6\' 1"  (1.854 m)   Wt 189 lb (85.7 kg)   BMI 24.94 kg/m  GENERAL:  Well appearing, elderly WM HEENT:  PERRL, EOMI, sclera are clear. Oropharynx is clear. + goiter NECK:  No jugular venous distention, carotid upstroke brisk and symmetric, no bruits, no thyromegaly or adenopathy LUNGS:  Clear to auscultation bilaterally CHEST:  Unremarkable HEART:  IRRR,  PMI not displaced or sustained,S1 and S2 within normal limits, no S3, no S4: no clicks, no rubs, no murmurs ABD:  Soft, nontender. BS +, no masses or bruits. No hepatomegaly, no splenomegaly EXT:  2 + pulses throughout, 3+ RLE and 1+ LLE edema, no cyanosis no clubbing SKIN:  Warm and dry.  No rashes NEURO:  Alert and oriented x 3. Cranial nerves II through XII intact. Tremor. Balance is poor.  PSYCH:  Cognitively intact    LABORATORY DATA:  Lab Results  Component Value Date   WBC 8.3 02/05/2017   HGB 14.3 02/05/2017   HCT 41.2 02/05/2017   PLT 182 02/05/2017   GLUCOSE 96 02/05/2017   ALT 23 06/18/2009   AST 24 06/18/2009   NA 141 02/05/2017   K 4.0 02/05/2017   CL 104 02/05/2017   CREATININE 0.68 02/05/2017   BUN 12 02/05/2017   CO2 28 02/05/2017   INR 3.12 (H) 06/21/2009   Dated 12/20/16: cholesterol 137, triglycerides 50, HDL 47, LDL 80.  Dated 06/26/17: normal chemistries, CBC, and TSH.   Ecg today shows atrial flutter with rate 86 bpm. Nonspecific IVCD. No change from prior study. I have personally reviewed and interpreted this study.  EXAM: CT CHEST WITH CONTRAST  TECHNIQUE: Multidetector CT imaging of the chest was performed during intravenous contrast administration.  CONTRAST:  9mL ISOVUE-300 IOPAMIDOL (ISOVUE-300)  INJECTION 61%  COMPARISON:  10/11/2012  FINDINGS: Cardiovascular: Chronic cardiomegaly. There is a small volume of pericardial fluid, slightly greater than before. No notable atherosclerotic calcification. No acute vascular finding.  Mediastinum/Nodes: Goiter with a right-sided substernal nodule  measuring 11 cm craniocaudal and causing deformity and mild narrowing of the trachea. Vascular displacement without compression from the thyroid mass. No regional adenopathy.  Calcified mediastinal lymph nodes attributed to old granulomatous disease.  Lungs/Pleura: Small layering pleural effusions, borderline moderate on the right. No Kerley lines or consolidation. Calcified pulmonary nodules. No suspicious pulmonary nodule.  Upper Abdomen: Layering gallstones.  No acute finding.  Musculoskeletal: Spondylosis with multilevel thoracic ankylosis. Remote left rib fractures. No acute or aggressive finding.  IMPRESSION: 1. Goiter with 11 cm intrathoracic right lobe mass that is stable from 2014. There is mass effect on the trachea and vascular structures without invasion or high-grade stenosis. 2. Cardiomegaly with small layering pleural effusions, borderline moderate on the right. 3. Cholelithiasis.   Electronically Signed   By: Monte Fantasia M.D.   On: 08/03/2017 09:46  Assessment & Plan:  1. Chronic atrial flutter - he is managed with rate control and anticoagulation. Continue Eliquis 5 mg BID. Rate is well controlled on propranolol.  2. Edema. He has some DOE. Prior CT showed small bilateral effusions. I have recommended adding lasix 20 mg daily. Continue sodium restriction and support hose. Will arrange for an Echocardiogram.   3. HTN - blood pressure is well controlled. No change in his current regimen.   4. Chronic tremor and imbalance. Followed by Neuro  5. Goiter.  I will follow up in 6 months.

## 2017-10-11 ENCOUNTER — Ambulatory Visit: Payer: Medicare Other | Admitting: Cardiology

## 2017-10-11 ENCOUNTER — Encounter: Payer: Self-pay | Admitting: Cardiology

## 2017-10-11 VITALS — BP 142/74 | HR 64 | Ht 73.0 in | Wt 189.0 lb

## 2017-10-11 DIAGNOSIS — R609 Edema, unspecified: Secondary | ICD-10-CM

## 2017-10-11 DIAGNOSIS — I483 Typical atrial flutter: Secondary | ICD-10-CM | POA: Diagnosis not present

## 2017-10-11 MED ORDER — FUROSEMIDE 20 MG PO TABS: 20.0000 mg | ORAL_TABLET | Freq: Every day | ORAL | 3 refills | Status: DC

## 2017-10-11 NOTE — Patient Instructions (Signed)
We will start you on lasix 20 mg daily for swelling  We will check a blood test in a couple of weeks.  We will check an echocardiogram

## 2017-10-19 ENCOUNTER — Other Ambulatory Visit (HOSPITAL_COMMUNITY): Payer: Medicare Other

## 2017-10-20 ENCOUNTER — Ambulatory Visit (HOSPITAL_COMMUNITY): Payer: Medicare Other | Attending: Surgery

## 2017-10-20 ENCOUNTER — Other Ambulatory Visit: Payer: Self-pay

## 2017-10-20 DIAGNOSIS — R609 Edema, unspecified: Secondary | ICD-10-CM | POA: Insufficient documentation

## 2017-10-20 DIAGNOSIS — I483 Typical atrial flutter: Secondary | ICD-10-CM | POA: Diagnosis not present

## 2017-10-20 DIAGNOSIS — I351 Nonrheumatic aortic (valve) insufficiency: Secondary | ICD-10-CM | POA: Diagnosis not present

## 2017-10-20 DIAGNOSIS — R600 Localized edema: Secondary | ICD-10-CM | POA: Diagnosis not present

## 2017-10-25 LAB — BASIC METABOLIC PANEL
BUN/Creatinine Ratio: 13 (ref 10–24)
BUN: 11 mg/dL (ref 8–27)
CALCIUM: 9.6 mg/dL (ref 8.6–10.2)
CHLORIDE: 103 mmol/L (ref 96–106)
CO2: 27 mmol/L (ref 20–29)
Creatinine, Ser: 0.87 mg/dL (ref 0.76–1.27)
GFR calc non Af Amer: 79 mL/min/{1.73_m2} (ref >59)
GFR, EST AFRICAN AMERICAN: 92 mL/min/{1.73_m2} (ref >59)
GLUCOSE: 94 mg/dL (ref 65–99)
POTASSIUM: 5.1 mmol/L (ref 3.5–5.2)
SODIUM: 144 mmol/L (ref 134–144)

## 2017-11-01 ENCOUNTER — Telehealth: Payer: Self-pay | Admitting: Cardiology

## 2017-11-01 NOTE — Telephone Encounter (Signed)
New Message ° ° °Patient is calling to obtain lab results. Please call.  °

## 2017-11-01 NOTE — Telephone Encounter (Signed)
Patient informed. 

## 2018-01-25 ENCOUNTER — Telehealth: Payer: Self-pay | Admitting: Cardiology

## 2018-01-25 NOTE — Telephone Encounter (Signed)
New Message:   Pt is requesting a new medication:apixaban (ELIQUIS) 5 MG TABS tablet   Copay went from 45 to 117

## 2018-01-25 NOTE — Telephone Encounter (Signed)
If he is unable to afford Eliquis this would not be any different for the other DOACs. He would need to switch to coumadin. He will need pharmacy appointment to transition from Eliquis to coumadin.  Clarabel Marion Martinique MD, [facility]

## 2018-01-25 NOTE — Telephone Encounter (Signed)
Spoke with pt, he reports he does not qualify for assistance from [employer] and needs to change to something cheaper. He does not feel he is in the donut hole. Samples placed at the front desk for pick up and will forward to dr Martinique to review and advise.

## 2018-01-25 NOTE — Telephone Encounter (Signed)
Returned call to patient no answer.LMTC. 

## 2018-01-26 NOTE — Telephone Encounter (Signed)
Received call back from patient Dr.Jordan's recommendations given.Stated he will just continue Eliquis.

## 2018-02-06 ENCOUNTER — Other Ambulatory Visit (HOSPITAL_BASED_OUTPATIENT_CLINIC_OR_DEPARTMENT_OTHER): Payer: Self-pay | Admitting: Family Medicine

## 2018-02-06 ENCOUNTER — Other Ambulatory Visit (HOSPITAL_BASED_OUTPATIENT_CLINIC_OR_DEPARTMENT_OTHER): Payer: Self-pay | Admitting: Internal Medicine

## 2018-02-06 DIAGNOSIS — S63289A Dislocation of proximal interphalangeal joint of unspecified finger, initial encounter: Secondary | ICD-10-CM | POA: Insufficient documentation

## 2018-02-06 DIAGNOSIS — W19XXXA Unspecified fall, initial encounter: Secondary | ICD-10-CM

## 2018-02-07 ENCOUNTER — Other Ambulatory Visit: Payer: Self-pay | Admitting: Family Medicine

## 2018-02-07 ENCOUNTER — Ambulatory Visit
Admission: RE | Admit: 2018-02-07 | Discharge: 2018-02-07 | Disposition: A | Discharge status: Home / Self Care | Payer: Medicare Other | Source: Ambulatory Visit | Attending: Family Medicine | Admitting: Family Medicine

## 2018-02-07 DIAGNOSIS — W19XXXA Unspecified fall, initial encounter: Secondary | ICD-10-CM

## 2018-02-20 ENCOUNTER — Encounter: Payer: Self-pay | Admitting: Podiatry

## 2018-02-20 ENCOUNTER — Ambulatory Visit: Payer: Medicare Other | Admitting: Podiatry

## 2018-02-20 VITALS — BP 125/77 | HR 59 | Temp 98.4°F

## 2018-02-20 DIAGNOSIS — M79671 Pain in right foot: Secondary | ICD-10-CM

## 2018-02-20 DIAGNOSIS — G8929 Other chronic pain: Secondary | ICD-10-CM

## 2018-02-20 DIAGNOSIS — M7741 Metatarsalgia, right foot: Secondary | ICD-10-CM | POA: Diagnosis not present

## 2018-02-21 NOTE — Progress Notes (Signed)
Subjective: 84 year old male presents the office today for concerns of pain to the right foot.  He states that when he gets up in the morning he gets pain to the foot.  He states when he takes pressure off the area gets better.  This is been ongoing for about 1 year.  I did put some metatarsal pads inside of the power step insert and this does help a lot side of his regular shoes however he is a neuroma house getting up in the mornings when he gets pain. Denies any systemic complaints such as fevers, chills, nausea, vomiting. No acute changes since last appointment, and no other complaints at this time.   Objective: AAO x3, NAD DP/PT pulses palpable bilaterally, CRT less than 3 seconds On today's exam there is no area pinpoint bony tenderness or pain to vibratory sensation bilaterally.  However upon evaluation there is significant prominence of metatarsal heads plantarly with atrophy.  On the right side worse than the left and subjectively this is where he is getting the tenderness.  There is no edema, erythema.  There is no palpable neuroma identified today. No open lesions or pre-ulcerative lesions.  No pain with calf compression, swelling, warmth, erythema  Assessment: Metatarsalgia right foot  Plan: -All treatment options discussed with the patient including all alternatives, risks, complications.  -I do think that his pain is resolved prominent metatarsal heads.  We discussed trying to wear off slipper worsening around the house with more memory foam for cushioning into the sole.  I also dispensed gel metatarsal offloading pads and he states that he can see to be helpful.  He is to try another likely come back to the office and purchase more.  Continue with the powerstep insert with metatarsal pads. -Patient encouraged to call the office with any questions, concerns, change in symptoms.   Trula Slade DPM

## 2018-05-24 ENCOUNTER — Encounter: Payer: Self-pay | Admitting: Neurology

## 2018-05-24 ENCOUNTER — Ambulatory Visit: Payer: Medicare Other | Admitting: Neurology

## 2018-05-24 VITALS — BP 139/67 | HR 65 | Ht 73.0 in | Wt 180.0 lb

## 2018-05-24 DIAGNOSIS — G252 Other specified forms of tremor: Secondary | ICD-10-CM

## 2018-05-24 DIAGNOSIS — R2689 Other abnormalities of gait and mobility: Secondary | ICD-10-CM | POA: Diagnosis not present

## 2018-05-24 DIAGNOSIS — Z9181 History of falling: Secondary | ICD-10-CM | POA: Diagnosis not present

## 2018-05-24 DIAGNOSIS — R251 Tremor, unspecified: Secondary | ICD-10-CM | POA: Diagnosis not present

## 2018-05-24 MED ORDER — TRIHEXYPHENIDYL HCL 2 MG PO TABS: 2.0000 mg | ORAL_TABLET | Freq: Two times a day (BID) | ORAL | 3 refills | Status: AC

## 2018-05-24 NOTE — Patient Instructions (Signed)
We should reduce your artane due to more issues with your balance and recent fall: take 2 mg 2 times a day.  Fall risk is real! Please remember to stand up slowly and get your bearings first turn slowly, no bending down to pick anything, no heavy lifting, be extra careful at night and first thing in the morning. Also, be careful in the Bathroom and the kitchen.  Please use your walker at all times, your balance is impaired. I don't think the cane is safe enough for you. Also, please talk to Dr. Joylene Draft about coming off the Ativan/lorazepam as it can increase your fall risk.  Please follow up in 6 months with the nurse practitioner.
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
## 2018-05-25 ENCOUNTER — Other Ambulatory Visit: Payer: Self-pay | Admitting: Cardiology

## 2018-06-04 NOTE — Progress Notes (Signed)
Kevin Potter Date of Birth: 03/02/1932 Medical Record #[id number]  History of Present Illness: Kevin Potter is seen for follow up of atrial flutter.  Last seen in August 2016. He has chronic atrial flutter. Intolerant to Rythmol and failed on past cardioversion back in 2008. In 2008 he did have a Myoview study which showed no ischemia and an ejection fraction of 47%. Echocardiogram at that time also showed an ejection fraction of 45-50%. He is managed with rate control and anticoagulation. He is on Eliquis. Last Echo in May 2019  showed normal LV function with EF 55-65%.   On follow up today he complains primarily with imbalance, weakness in his right arm and swelling in his legs R>L. He tried diuretics in May but he states he couldn't tolerate the increase in urination. He has tried compression hose but found it very hard to get on and off. No chest pain, dyspnea, dizziness. Notes he has had problems with increased phlegm for the past 7 years. He is followed by Neurology and Artane dose recently reduced.   Current Outpatient Medications on File Prior to Visit  Medication Sig Dispense Refill  . alfuzosin (UROXATRAL) 10 MG 24 hr tablet     . ELIQUIS 5 MG TABS tablet TAKE ONE TABLET BY MOUTH TWICE A DAY 60 tablet 4  . fluticasone (FLONASE) 50 MCG/ACT nasal spray Place 1 spray into the nose as needed.     Marland Kitchen LORazepam (ATIVAN) 1 MG tablet Take 1 mg by mouth daily.      Marland Kitchen omeprazole (PRILOSEC) 40 MG capsule Take 1 capsule (40 mg total) by mouth 2 (two) times daily. (Patient taking differently: Take 80 mg by mouth 2 (two) times daily. ) 60 capsule 3  . propranolol (INDERAL) 20 MG tablet TAKE 1 TABLET BY MOUTH THREE TIMES DAILY 90 tablet 10  . tamsulosin (FLOMAX) 0.4 MG CAPS capsule     . trihexyphenidyl (ARTANE) 2 MG tablet Take 1 tablet (2 mg total) by mouth 2 (two) times daily with a meal. 180 tablet 3   Current Facility-Administered Medications on File Prior to Visit  Medication Dose Route  Frequency Provider Last Rate Last Dose  . 0.9 %  sodium chloride infusion  500 mL Intravenous Once Kevin Shipper, MD        No Known Allergies  Past Medical History:  Diagnosis Date  . 1st degree AV block   . Allergy   . Arthritis    right foot ,   . Atrial flutter ([place])   . BPH (benign prostatic hyperplasia)   . Chicken pox   . Chronic anticoagulation   . Colon polyps   . Colonic adenoma   . Diverticulosis   . Erectile dysfunction   . Gallstones   . GERD (gastroesophageal reflux disease)   . Gilbert's syndrome   . Goiter   . Hemorrhoids   . Hiatal hernia   . Hypertension   . Hyperthyroidism   . Hypothyroidism   . Sleep apnea    no cpap now- lost weight   . Tremor     Past Surgical History:  Procedure Laterality Date  . APPENDECTOMY    . CARDIOVERSION  03/15/2007   elective DC/x1 with joint pads in the anterior & posterior position/no complications  . COLONOSCOPY    . left ankle surgery    . NASAL SEPTUM SURGERY    . PALATE / UVULA BIOPSY / EXCISION    . POLYPECTOMY    . TONSILLECTOMY  with uvula removed  . UVA      Social History   Tobacco Use  Smoking Status Never Smoker  Smokeless Tobacco Never Used    Social History   Substance and Sexual Activity  Alcohol Use Yes  . Alcohol/week: 0.0 standard drinks   Comment: red wine at night    Family History  Problem Relation Age of Onset  . Heart disease Father   . Cancer Mother        unknown type  . Heart disease Mother   . Cancer Sister        unknown type  . Heart disease Brother   . Colon cancer Neg Hx   . Colon polyps Neg Hx   . Esophageal cancer Neg Hx   . Rectal cancer Neg Hx   . Stomach cancer Neg Hx     Review of Systems: The review of systems is per the HPI.  All other systems were reviewed and are negative.  Physical Exam: BP 134/68   Pulse (!) 59   Ht 6\' 1"  (1.854 m)   Wt 176 lb (79.8 kg)   BMI 23.22 kg/m  GENERAL:  Well appearing, elderly Kevin Potter  HEENT:  PERRL, EOMI,  sclera are clear. Oropharynx is clear. NECK:  No jugular venous distention, carotid upstroke brisk and symmetric, no bruits, no thyromegaly or adenopathy LUNGS:  Clear to auscultation bilaterally CHEST:  Unremarkable HEART:  IRRR,  PMI not displaced or sustained,S1 and S2 within normal limits, no S3, no S4: no clicks, no rubs, no murmurs ABD:  Soft, nontender. BS +, no masses or bruits. No hepatomegaly, no splenomegaly EXT:  2 + pulses throughout, 2+ RLE and 1+ LLE edema, no cyanosis no clubbing SKIN:  Warm and dry.  No rashes NEURO:  Alert and oriented x 3. Cranial nerves II through XII intact. PSYCH:  Cognitively intact      LABORATORY DATA:  Lab Results  Component Value Date   WBC 8.3 02/05/2017   HGB 14.3 02/05/2017   HCT 41.2 02/05/2017   PLT 182 02/05/2017   GLUCOSE 94 10/25/2017   ALT 23 06/18/2009   AST 24 06/18/2009   NA 144 10/25/2017   K 5.1 10/25/2017   CL 103 10/25/2017   CREATININE 0.87 10/25/2017   BUN 11 10/25/2017   CO2 27 10/25/2017   INR 3.12 (H) 06/21/2009   Dated 12/20/16: cholesterol 137, triglycerides 50, HDL 47, LDL 80.  Dated 06/26/17: normal chemistries, CBC, and TSH.  Dated 02/06/18: cholesterol 127, triglycerides 54, HDL 46, LDL 70. CBC, CMET and TSH normal.   Ecg today shows atrial flutter with rate 59 bpm. Nonspecific IVCD. No change from prior study. I have personally reviewed and interpreted this study.  Echo 10/20/17: Study Conclusions  - Left ventricle: The cavity size was normal. There was mild   concentric hypertrophy. Systolic function was normal. The   estimated ejection fraction was in the range of 55% to 60%. Wall   motion was normal; there were no regional wall motion   abnormalities. - Aortic valve: There was trivial regurgitation. - Left atrium: The atrium was moderately dilated. - Right atrium: The atrium was moderately dilated. - Pulmonary arteries: Systolic pressure was mildly increased. PA   peak pressure: 36 mm Hg  (S).  Assessment & Plan:  1. Chronic atrial flutter - he is managed with rate control and anticoagulation. Rate is good. Continue Eliquis 5 mg BID.   2. Edema. Chronic secondary to venous insufficiency. Intolerant of  lasix and compression hose.  Recommend elevation when possible and sodium restriction. By my exam swelling is decreased compared to May. Echo in May was fairly unremarkable.   3. HTN - blood pressure is well controlled. No change in his current regimen.   4. Chronic tremor and imbalance. Followed by Neuro  5. Goiter.  I will follow up in one year.

## 2018-06-05 ENCOUNTER — Telehealth: Payer: Self-pay | Admitting: Cardiology

## 2018-06-05 ENCOUNTER — Other Ambulatory Visit: Payer: Self-pay

## 2018-06-05 NOTE — Telephone Encounter (Signed)
Returned call to patient he stated he took Furosemide 20 mg daily for about 1 month.Stated he had to stop taking made him urinate too frequently.He wanted to know if he needed to keep appointment with Dr.Jordan 1/2.Stated he was feeling fine.Advised to keep appointment as planned.

## 2018-06-05 NOTE — Telephone Encounter (Signed)
New Message     Pt c/o medication issue:  1. Name of Medication: Furosemide  2. How are you currently taking this medication (dosage and times per day)? 20mg  taken once a day   3. Are you having a reaction (difficulty breathing--STAT)? no  4. What is your medication issue? Patient hasn't been taken the medication as instructed due to  causing him to use the bathroom more than usual.  Patient wants to know if he should still keep appointment for 06/07/2018 or reschedule

## 2018-06-07 ENCOUNTER — Ambulatory Visit: Payer: Medicare Other | Admitting: Cardiology

## 2018-06-07 ENCOUNTER — Encounter: Payer: Self-pay | Admitting: Cardiology

## 2018-06-07 VITALS — BP 134/68 | HR 59 | Ht 73.0 in | Wt 176.0 lb

## 2018-06-07 DIAGNOSIS — I483 Typical atrial flutter: Secondary | ICD-10-CM

## 2018-06-07 DIAGNOSIS — R609 Edema, unspecified: Secondary | ICD-10-CM

## 2018-06-09 ENCOUNTER — Other Ambulatory Visit: Payer: Self-pay | Admitting: Neurology

## 2018-07-03 ENCOUNTER — Other Ambulatory Visit: Payer: Self-pay | Admitting: Internal Medicine

## 2018-07-03 DIAGNOSIS — R413 Other amnesia: Secondary | ICD-10-CM

## 2018-07-21 ENCOUNTER — Other Ambulatory Visit: Payer: Self-pay | Admitting: Internal Medicine

## 2018-07-21 ENCOUNTER — Ambulatory Visit
Admission: RE | Admit: 2018-07-21 | Discharge: 2018-07-21 | Disposition: A | Discharge status: Home / Self Care | Payer: Medicare Other | Source: Ambulatory Visit | Attending: Internal Medicine | Admitting: Internal Medicine

## 2018-07-21 DIAGNOSIS — R413 Other amnesia: Secondary | ICD-10-CM

## 2018-08-08 ENCOUNTER — Encounter: Payer: Self-pay | Admitting: Adult Health

## 2018-09-06 ENCOUNTER — Other Ambulatory Visit: Payer: Self-pay | Admitting: Neurology

## 2018-09-24 ENCOUNTER — Telehealth: Payer: Self-pay

## 2018-09-24 MED ORDER — APIXABAN 5 MG PO TABS: 5.0000 mg | ORAL_TABLET | Freq: Two times a day (BID) | ORAL | 1 refills | Status: AC

## 2018-09-24 NOTE — Telephone Encounter (Signed)
85 yo, 79.8kg, Scr 0.87 on 10/25/17 Last OV 06/07/18 Indication aflutter

## 2018-11-12 ENCOUNTER — Other Ambulatory Visit: Payer: Self-pay | Admitting: Neurology

## 2018-11-25 ENCOUNTER — Other Ambulatory Visit: Payer: Self-pay | Admitting: Neurology

## 2019-01-02 ENCOUNTER — Ambulatory Visit: Payer: Medicare Other | Admitting: Neurology

## 2019-01-02 ENCOUNTER — Ambulatory Visit: Payer: Medicare Other | Admitting: Adult Health

## 2019-01-02 ENCOUNTER — Telehealth: Payer: Self-pay

## 2019-01-02 NOTE — Telephone Encounter (Signed)
Pt did not show for their appt with Dr. Athar today.  

## 2019-01-21 ENCOUNTER — Encounter (HOSPITAL_COMMUNITY): Payer: Self-pay | Admitting: Emergency Medicine

## 2019-01-21 ENCOUNTER — Other Ambulatory Visit: Payer: Self-pay

## 2019-01-21 ENCOUNTER — Emergency Department (HOSPITAL_COMMUNITY): Payer: Medicare Other

## 2019-01-21 ENCOUNTER — Inpatient Hospital Stay (HOSPITAL_COMMUNITY)
Admission: EM | Admit: 2019-01-21 | Discharge: 2019-01-27 | DRG: 392 | Disposition: A | Discharge status: Home Health | Payer: Medicare Other | Attending: Internal Medicine | Admitting: Internal Medicine

## 2019-01-21 DIAGNOSIS — R1084 Generalized abdominal pain: Secondary | ICD-10-CM

## 2019-01-21 DIAGNOSIS — I4892 Unspecified atrial flutter: Secondary | ICD-10-CM | POA: Diagnosis not present

## 2019-01-21 DIAGNOSIS — R2681 Unsteadiness on feet: Secondary | ICD-10-CM | POA: Diagnosis present

## 2019-01-21 DIAGNOSIS — K529 Noninfective gastroenteritis and colitis, unspecified: Secondary | ICD-10-CM | POA: Diagnosis not present

## 2019-01-21 DIAGNOSIS — E876 Hypokalemia: Secondary | ICD-10-CM | POA: Diagnosis not present

## 2019-01-21 DIAGNOSIS — R1013 Epigastric pain: Secondary | ICD-10-CM | POA: Diagnosis not present

## 2019-01-21 DIAGNOSIS — E0500 Thyrotoxicosis with diffuse goiter without thyrotoxic crisis or storm: Secondary | ICD-10-CM | POA: Diagnosis present

## 2019-01-21 DIAGNOSIS — I10 Essential (primary) hypertension: Secondary | ICD-10-CM

## 2019-01-21 DIAGNOSIS — R05 Cough: Secondary | ICD-10-CM

## 2019-01-21 DIAGNOSIS — R5381 Other malaise: Secondary | ICD-10-CM

## 2019-01-21 DIAGNOSIS — Z8249 Family history of ischemic heart disease and other diseases of the circulatory system: Secondary | ICD-10-CM

## 2019-01-21 DIAGNOSIS — E0590 Thyrotoxicosis, unspecified without thyrotoxic crisis or storm: Secondary | ICD-10-CM | POA: Diagnosis present

## 2019-01-21 DIAGNOSIS — I440 Atrioventricular block, first degree: Secondary | ICD-10-CM | POA: Diagnosis present

## 2019-01-21 DIAGNOSIS — Z7189 Other specified counseling: Secondary | ICD-10-CM

## 2019-01-21 DIAGNOSIS — Z66 Do not resuscitate: Secondary | ICD-10-CM | POA: Diagnosis present

## 2019-01-21 DIAGNOSIS — Z809 Family history of malignant neoplasm, unspecified: Secondary | ICD-10-CM

## 2019-01-21 DIAGNOSIS — R251 Tremor, unspecified: Secondary | ICD-10-CM | POA: Diagnosis present

## 2019-01-21 DIAGNOSIS — N400 Enlarged prostate without lower urinary tract symptoms: Secondary | ICD-10-CM | POA: Diagnosis present

## 2019-01-21 DIAGNOSIS — I5022 Chronic systolic (congestive) heart failure: Secondary | ICD-10-CM | POA: Diagnosis not present

## 2019-01-21 DIAGNOSIS — Z7901 Long term (current) use of anticoagulants: Secondary | ICD-10-CM

## 2019-01-21 DIAGNOSIS — F0390 Unspecified dementia without behavioral disturbance: Secondary | ICD-10-CM | POA: Diagnosis present

## 2019-01-21 DIAGNOSIS — K567 Ileus, unspecified: Secondary | ICD-10-CM

## 2019-01-21 DIAGNOSIS — Z515 Encounter for palliative care: Secondary | ICD-10-CM

## 2019-01-21 DIAGNOSIS — R059 Cough, unspecified: Secondary | ICD-10-CM

## 2019-01-21 DIAGNOSIS — R197 Diarrhea, unspecified: Secondary | ICD-10-CM

## 2019-01-21 DIAGNOSIS — K219 Gastro-esophageal reflux disease without esophagitis: Secondary | ICD-10-CM | POA: Diagnosis present

## 2019-01-21 DIAGNOSIS — Z20828 Contact with and (suspected) exposure to other viral communicable diseases: Secondary | ICD-10-CM | POA: Diagnosis present

## 2019-01-21 DIAGNOSIS — I110 Hypertensive heart disease with heart failure: Secondary | ICD-10-CM | POA: Diagnosis present

## 2019-01-21 LAB — COMPREHENSIVE METABOLIC PANEL
ALT: 13 U/L (ref 0–44)
AST: 14 U/L — ABNORMAL LOW (ref 15–41)
Albumin: 3.7 g/dL (ref 3.5–5.0)
Alkaline Phosphatase: 93 U/L (ref 38–126)
Anion gap: 9 (ref 5–15)
BUN: 12 mg/dL (ref 8–23)
CO2: 29 mmol/L (ref 22–32)
Calcium: 9.5 mg/dL (ref 8.9–10.3)
Chloride: 101 mmol/L (ref 98–111)
Creatinine, Ser: 0.89 mg/dL (ref 0.61–1.24)
GFR calc Af Amer: >60 mL/min (ref >60)
GFR calc non Af Amer: >60 mL/min (ref >60)
Glucose, Bld: 107 mg/dL — ABNORMAL HIGH (ref 70–99)
Potassium: 4 mmol/L (ref 3.5–5.1)
Sodium: 139 mmol/L (ref 135–145)
Total Bilirubin: 2.8 mg/dL — ABNORMAL HIGH (ref 0.3–1.2)
Total Protein: 6.7 g/dL (ref 6.5–8.1)

## 2019-01-21 LAB — CBC WITH DIFFERENTIAL/PLATELET
Abs Immature Granulocytes: 0.02 10*3/uL (ref 0.00–0.07)
Basophils Absolute: 0.1 10*3/uL (ref 0.0–0.1)
Basophils Relative: 1 %
Eosinophils Absolute: 0 10*3/uL (ref 0.0–0.5)
Eosinophils Relative: 1 %
HCT: 43.6 % (ref 39.0–52.0)
Hemoglobin: 14.2 g/dL (ref 13.0–17.0)
Immature Granulocytes: 0 %
Lymphocytes Relative: 9 %
Lymphs Abs: 0.6 10*3/uL — ABNORMAL LOW (ref 0.7–4.0)
MCH: 31.1 pg (ref 26.0–34.0)
MCHC: 32.6 g/dL (ref 30.0–36.0)
MCV: 95.6 fL (ref 80.0–100.0)
Monocytes Absolute: 0.5 10*3/uL (ref 0.1–1.0)
Monocytes Relative: 8 %
Neutro Abs: 4.8 10*3/uL (ref 1.7–7.7)
Neutrophils Relative %: 81 %
Platelets: 161 10*3/uL (ref 150–400)
RBC: 4.56 MIL/uL (ref 4.22–5.81)
RDW: 13.7 % (ref 11.5–15.5)
WBC: 6 10*3/uL (ref 4.0–10.5)
nRBC: 0 % (ref 0.0–0.2)

## 2019-01-21 LAB — MAGNESIUM: Magnesium: 1.9 mg/dL (ref 1.7–2.4)

## 2019-01-21 LAB — LIPASE, BLOOD: Lipase: 26 U/L (ref 11–51)

## 2019-01-21 LAB — TROPONIN I (HIGH SENSITIVITY)
Troponin I (High Sensitivity): 16 ng/L (ref <18)
Troponin I (High Sensitivity): 15 ng/L (ref <18)

## 2019-01-21 LAB — TSH: TSH: 0.169 u[IU]/mL — ABNORMAL LOW (ref 0.350–4.500)

## 2019-01-21 LAB — BRAIN NATRIURETIC PEPTIDE: B Natriuretic Peptide: 325.8 pg/mL — ABNORMAL HIGH (ref 0.0–100.0)

## 2019-01-21 LAB — SARS CORONAVIRUS 2 BY RT PCR (HOSPITAL ORDER, PERFORMED IN ~~LOC~~ HOSPITAL LAB): SARS Coronavirus 2: NEGATIVE

## 2019-01-21 MED ORDER — TRIHEXYPHENIDYL HCL 2 MG PO TABS
2.0000 mg | ORAL_TABLET | Freq: Two times a day (BID) | ORAL | Status: DC
  Administered 2019-01-22 – 2019-01-27 (×11): 2 mg via ORAL
  Filled 2019-01-21 (×12): qty 1

## 2019-01-21 MED ORDER — ONDANSETRON HCL 4 MG/2ML IJ SOLN: 4.0000 mg | Freq: Four times a day (QID) | INTRAMUSCULAR | Status: DC | PRN

## 2019-01-21 MED ORDER — ALFUZOSIN HCL ER 10 MG PO TB24
10.0000 mg | ORAL_TABLET | Freq: Every day | ORAL | Status: DC
  Administered 2019-01-22 – 2019-01-27 (×6): 10 mg via ORAL
  Filled 2019-01-21 (×6): qty 1

## 2019-01-21 MED ORDER — TAMSULOSIN HCL 0.4 MG PO CAPS
0.4000 mg | ORAL_CAPSULE | Freq: Every day | ORAL | Status: DC
  Administered 2019-01-22 – 2019-01-26 (×5): 0.4 mg via ORAL
  Filled 2019-01-21 (×5): qty 1

## 2019-01-21 MED ORDER — IOHEXOL 300 MG/ML  SOLN
100.0000 mL | Freq: Once | INTRAMUSCULAR | Status: AC | PRN
  Administered 2019-01-21: 100 mL via INTRAVENOUS

## 2019-01-21 MED ORDER — ACETAMINOPHEN 650 MG RE SUPP: 650.0000 mg | Freq: Four times a day (QID) | RECTAL | Status: DC | PRN

## 2019-01-21 MED ORDER — LORAZEPAM 1 MG PO TABS
1.0000 mg | ORAL_TABLET | Freq: Every day | ORAL | Status: DC
  Administered 2019-01-22 – 2019-01-27 (×6): 1 mg via ORAL
  Filled 2019-01-21 (×6): qty 2

## 2019-01-21 MED ORDER — ACETAMINOPHEN 325 MG PO TABS: 650.0000 mg | ORAL_TABLET | Freq: Four times a day (QID) | ORAL | Status: DC | PRN

## 2019-01-21 MED ORDER — APIXABAN 5 MG PO TABS
5.0000 mg | ORAL_TABLET | Freq: Two times a day (BID) | ORAL | Status: DC
  Administered 2019-01-21 – 2019-01-27 (×12): 5 mg via ORAL
  Filled 2019-01-21 (×12): qty 1

## 2019-01-21 MED ORDER — SODIUM CHLORIDE 0.9 % IV SOLN
INTRAVENOUS | Status: DC
  Administered 2019-01-21: 23:00:00 via INTRAVENOUS

## 2019-01-21 MED ORDER — PANTOPRAZOLE SODIUM 40 MG PO TBEC
40.0000 mg | DELAYED_RELEASE_TABLET | Freq: Every day | ORAL | Status: DC
  Administered 2019-01-22 – 2019-01-27 (×6): 40 mg via ORAL
  Filled 2019-01-21 (×6): qty 1

## 2019-01-21 MED ORDER — ONDANSETRON HCL 4 MG PO TABS: 4.0000 mg | ORAL_TABLET | Freq: Four times a day (QID) | ORAL | Status: DC | PRN

## 2019-01-21 MED ORDER — HYDROCODONE-ACETAMINOPHEN 5-325 MG PO TABS: 1.0000 | ORAL_TABLET | ORAL | Status: DC | PRN

## 2019-01-21 NOTE — ED Notes (Signed)
Attempted report 

## 2019-01-21 NOTE — ED Notes (Signed)
ED TO INPATIENT HANDOFF REPORT  ED Nurse Name and Phone #: Ashten Prats   S Name/Age/Gender Kevin Potter 85 y.o. male Room/Bed: 020C/020C  Code Status   Code Status: Not on file  Home/SNF/Other Home Patient oriented to: self, place, time and situation Is this baseline? Yes   Triage Complete: Triage complete  Chief Complaint ABD PAIN  Triage Note Pt here for abdominal pain since this morning. Pts HR was also noted to be in the 30s in triage.    Allergies No Known Allergies  Level of Care/Admitting Diagnosis ED Disposition    ED Disposition Condition [facility]: [place] [100100]  Level of Care: Telemetry Medical [104]  I expect the patient will be discharged within 24 hours: No (not a candidate for 5C-Observation unit)  Covid Evaluation: Asymptomatic Screening Protocol (No Symptoms)  Diagnosis: Ileus [facility]) [[id number]]  Admitting Physician: Toy Baker [[id number]]  Attending Physician: Toy Baker [[id number]]  PT Class (Do Not Modify): Observation [104]  PT Acc Code (Do Not Modify): Observation [10022]       B Medical/Surgery History Past Medical History:  Diagnosis Date  . 1st degree AV block   . Allergy   . Arthritis    right foot ,   . Atrial flutter ([place])   . BPH (benign prostatic hyperplasia)   . Chicken pox   . Chronic anticoagulation   . Colon polyps   . Colonic adenoma   . Diverticulosis   . Erectile dysfunction   . Gallstones   . GERD (gastroesophageal reflux disease)   . Gilbert's syndrome   . Goiter   . Hemorrhoids   . Hiatal hernia   . Hypertension   . Hyperthyroidism   . Hypothyroidism   . Sleep apnea    no cpap now- lost weight   . Tremor    Past Surgical History:  Procedure Laterality Date  . APPENDECTOMY    . CARDIOVERSION  03/15/2007   elective DC/x1 with joint pads in the anterior & posterior position/no complications  . COLONOSCOPY    . left ankle surgery    . NASAL SEPTUM SURGERY     . PALATE / UVULA BIOPSY / EXCISION    . POLYPECTOMY    . TONSILLECTOMY     with uvula removed  . UVA       A IV Location/Drains/Wounds Patient Lines/Drains/Airways Status   Active Line/Drains/Airways    Name:   Placement date:   Placement time:   Site:   Days:   Peripheral IV 01/21/19 Left Antecubital   01/21/19    1515    Antecubital   less than 1          Intake/Output Last 24 hours No intake or output data in the 24 hours ending 01/21/19 2213  Labs/Imaging Results for orders placed or performed during the hospital encounter of 01/21/19 (from the past 48 hour(s))  Comprehensive metabolic panel     Status: Abnormal   Collection Time: 01/21/19  3:07 PM  Result Value Ref Range   Sodium 139 135 - 145 mmol/L   Potassium 4.0 3.5 - 5.1 mmol/L   Chloride 101 98 - 111 mmol/L   CO2 29 22 - 32 mmol/L   Glucose, Bld 107 (H) 70 - 99 mg/dL   BUN 12 8 - 23 mg/dL   Creatinine, Ser 0.89 0.61 - 1.24 mg/dL   Calcium 9.5 8.9 - 10.3 mg/dL   Total Protein 6.7 6.5 - 8.1 g/dL  Albumin 3.7 3.5 - 5.0 g/dL   AST 14 (L) 15 - 41 U/L   ALT 13 0 - 44 U/L   Alkaline Phosphatase 93 38 - 126 U/L   Total Bilirubin 2.8 (H) 0.3 - 1.2 mg/dL   GFR calc non Af Amer >60 >60 mL/min   GFR calc Af Amer >60 >60 mL/min   Anion gap 9 5 - 15    Comment: Performed at [facility] [address] [place], [place] [address]  Lipase, blood     Status: None   Collection Time: 01/21/19  3:07 PM  Result Value Ref Range   Lipase 26 11 - 51 U/L    Comment: Performed at [facility], [place] [address] [place], [place] [address]  Troponin I (High Sensitivity)     Status: None   Collection Time: 01/21/19  3:07 PM  Result Value Ref Range   Troponin I (High Sensitivity) 16 <18 ng/L    Comment: (NOTE) Elevated high sensitivity troponin I (hsTnI) values and significant  changes across serial measurements may suggest ACS but many other  chronic and acute conditions are known to elevate hsTnI results.  Refer  to the "Links" section for chest pain algorithms and additional  guidance. Performed at [facility], [place] [address] [place], [place] [address]   CBC with Differential     Status: Abnormal   Collection Time: 01/21/19  3:07 PM  Result Value Ref Range   WBC 6.0 4.0 - 10.5 K/uL   RBC 4.56 4.22 - 5.81 MIL/uL   Hemoglobin 14.2 13.0 - 17.0 g/dL   HCT 43.6 39.0 - 52.0 %   MCV 95.6 80.0 - 100.0 fL   MCH 31.1 26.0 - 34.0 pg   MCHC 32.6 30.0 - 36.0 g/dL   RDW 13.7 11.5 - 15.5 %   Platelets 161 150 - 400 K/uL   nRBC 0.0 0.0 - 0.2 %   Neutrophils Relative % 81 %   Neutro Abs 4.8 1.7 - 7.7 K/uL   Lymphocytes Relative 9 %   Lymphs Abs 0.6 (L) 0.7 - 4.0 K/uL   Monocytes Relative 8 %   Monocytes Absolute 0.5 0.1 - 1.0 K/uL   Eosinophils Relative 1 %   Eosinophils Absolute 0.0 0.0 - 0.5 K/uL   Basophils Relative 1 %   Basophils Absolute 0.1 0.0 - 0.1 K/uL   Immature Granulocytes 0 %   Abs Immature Granulocytes 0.02 0.00 - 0.07 K/uL    Comment: Performed at [facility] [address], [place], [place] [address]  TSH     Status: Abnormal   Collection Time: 01/21/19  3:07 PM  Result Value Ref Range   TSH 0.169 (L) 0.350 - 4.500 uIU/mL    Comment: Performed by a 3rd Generation assay with a functional sensitivity of <=0.01 uIU/mL. Performed at [facility], [place] [address], [place], [place] [address]   Magnesium     Status: None   Collection Time: 01/21/19  3:07 PM  Result Value Ref Range   Magnesium 1.9 1.7 - 2.4 mg/dL    Comment: Performed at [facility], [place] [address] [place], [place] [address]  Brain natriuretic peptide     Status: Abnormal   Collection Time: 01/21/19  3:07 PM  Result Value Ref Range   B Natriuretic Peptide 325.8 (H) 0.0 - 100.0 pg/mL    Comment: Performed at [facility] [place],  [place] [address]  Troponin I (High Sensitivity)     Status: None   Collection Time: 01/21/19  5:07 PM  Result Value Ref Range   Troponin  I (High Sensitivity) 15 <18 ng/L    Comment: (NOTE) Elevated high sensitivity troponin I (hsTnI) values and significant  changes across serial measurements may suggest ACS but many other  chronic and acute conditions are known to elevate hsTnI results.  Refer to the "Links" section for chest pain algorithms and additional  guidance. Performed at [facility], [place] [address] [place], [place] [address]   SARS Coronavirus 2 [facility] order, Performed in [facility] hospital lab) Nasopharyngeal Nasopharyngeal Swab     Status: None   Collection Time: 01/21/19  7:39 PM   Specimen: Nasopharyngeal Swab  Result Value Ref Range   SARS Coronavirus 2 NEGATIVE NEGATIVE    Comment: (NOTE) If result is NEGATIVE SARS-CoV-2 target nucleic acids are NOT DETECTED. The SARS-CoV-2 RNA is generally detectable in upper and lower  respiratory specimens during the acute phase of infection. The lowest  concentration of SARS-CoV-2 viral copies this assay can detect is 250  copies / mL. A negative result does not preclude SARS-CoV-2 infection  and should not be used as the sole basis for treatment or other  patient management decisions.  A negative result may occur with  improper specimen collection / handling, submission of specimen other  than nasopharyngeal swab, presence of viral mutation(s) within the  areas targeted by this assay, and inadequate number of viral copies  (<250 copies / mL). A negative result must be combined with clinical  observations, patient history, and epidemiological information. If result is POSITIVE SARS-CoV-2 target nucleic acids are DETECTED. The SARS-CoV-2 RNA is generally detectable in upper and lower  respiratory specimens dur ing the acute phase of infection.  Positive  results are indicative of active infection with SARS-CoV-2.  Clinical  correlation with patient history and other diagnostic information is  necessary to determine patient infection status.  Positive  results do  not rule out bacterial infection or co-infection with other viruses. If result is PRESUMPTIVE POSTIVE SARS-CoV-2 nucleic acids MAY BE PRESENT.   A presumptive positive result was obtained on the submitted specimen  and confirmed on repeat testing.  While 2019 novel coronavirus  (SARS-CoV-2) nucleic acids may be present in the submitted sample  additional confirmatory testing may be necessary for epidemiological  and / or clinical management purposes  to differentiate between  SARS-CoV-2 and other Sarbecovirus currently known to infect humans.  If clinically indicated additional testing with an alternate test  methodology [phone]) is advised. The SARS-CoV-2 RNA is generally  detectable in upper and lower respiratory sp ecimens during the acute  phase of infection. The expected result is Negative. Fact Sheet for Patients:  StrictlyIdeas.no Fact Sheet for Healthcare Providers: BankingDealers.co.za This test is not yet approved or cleared by the [place] FDA and has been authorized for detection and/or diagnosis of SARS-CoV-2 by FDA under an Emergency Use Authorization (EUA).  This EUA will remain in effect (meaning this test can be used) for the duration of the COVID-19 declaration under Section 564(b)(1) of the Act, 21 U.S.C. section 360bbb-3(b)(1), unless the authorization is terminated or revoked sooner. Performed at [facility], [place] [address], [place], [place] [address]    Ct Head Wo Contrast  Result Date: 01/21/2019 CLINICAL DATA:  Generalized weakness EXAM: CT HEAD WITHOUT CONTRAST TECHNIQUE: Contiguous axial images were obtained from the base  of the skull through the vertex without intravenous contrast. COMPARISON:  07/21/2018 MR brain FINDINGS: Brain: No evidence of acute infarction, hemorrhage, extra-axial collection, ventriculomegaly, or mass effect. Generalized cerebral atrophy. Periventricular white  matter low attenuation likely secondary to microangiopathy. Vascular: Cerebrovascular atherosclerotic calcifications are noted. Skull: Negative for fracture or focal lesion. Sinuses/Orbits: Visualized portions of the orbits are unremarkable. Visualized portions of the paranasal sinuses and mastoid air cells are unremarkable. Other: None. IMPRESSION: No acute intracranial pathology. Electronically Signed   By: Kathreen Devoid   On: 01/21/2019 17:25   Ct Abdomen Pelvis W Contrast  Result Date: 01/21/2019 CLINICAL DATA:  Diffuse abdominal pain since this morning, history stroke, GERD, hypertension, diverticulosis, atrial flutter EXAM: CT ABDOMEN AND PELVIS WITH CONTRAST TECHNIQUE: Multidetector CT imaging of the abdomen and pelvis was performed using the standard protocol following bolus administration of intravenous contrast. Sagittal and coronal MPR images reconstructed from axial data set. CONTRAST:  17mL OMNIPAQUE IOHEXOL 300 MG/ML SOLN IV. No oral contrast. COMPARISON:  None FINDINGS: Lower chest: BILATERAL pleural effusions, moderate RIGHT and small LEFT. Bibasilar atelectasis. Calcified granulomata in the lower lobes. Enlargement of cardiac chambers with small pericardial effusion. Hepatobiliary: Multiple dependent calculi within gallbladder. No biliary dilatation. Liver otherwise normal appearance Pancreas: Atrophic, otherwise unremarkable Spleen: Calcified granulomata, otherwise normal appearance Adrenals/Urinary Tract: Adrenal glands normal appearance. BILATERAL peripelvic renal cysts. Kidneys otherwise normal appearance. Ureters and bladder unremarkable. Stomach/Bowel: Sigmoid and descending colonic diverticulosis without evidence of diverticulitis. Fluid in RIGHT colon. Colon otherwise decompressed. Stomach mildly distended by fluid and air. Multiple dilated small bowel loops throughout the abdomen and pelvis with distal ileum decompressed. Short segment of distal ileum shows questionable mild wall  thickening. Findings could represent ileus or low-grade small bowel obstruction. Vascular/Lymphatic: Atherosclerotic calcifications of aorta and iliac arteries. Major vascular structures appear patent. Reproductive: Prostatic enlargement, gland 6.4 x 5.7 x 7.6 cm (volume = 150 cm^3) Other: Free fluid in pelvis and between bowel loops. No definite free air. No hernia. Musculoskeletal: Diffuse osseous demineralization. Degenerative changes of the hip joints. Scattered degenerative disc disease changes lumbar spine. Osseous demineralization. IMPRESSION: Dilated small bowel loops extending to distal ileum where minimal wall thickening is seen, question ileus versus low-grade small-bowel obstruction. Small amounts of scattered free fluid without evidence of free air or perforation. Marked prostatic enlargement. BILATERAL peripelvic renal cysts. Bibasilar pleural effusions and atelectasis RIGHT greater than LEFT. Small pericardial effusion with enlargement of cardiac chambers. Electronically Signed   By: Lavonia Dana M.D.   On: 01/21/2019 17:34   Dg Chest Portable 1 View  Result Date: 01/21/2019 CLINICAL DATA:  Short of breath EXAM: PORTABLE CHEST 1 VIEW COMPARISON:  Portable exam 1524 hours compared to 02/05/2017 Correlation: CT chest 08/03/2017 FINDINGS: Enlargement of cardiac silhouette. Large RIGHT paratracheal mass extending from the level of the clavicular heads to the RIGHT hilum, measuring 12.1 x 9.9 cm in size, exerting significant RIGHT to LEFT mass effect upon the trachea with tracheal narrowing. 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 Small RIGHT pleural effusion with bibasilar atelectasis. Upper lungs clear. Calcified granuloma LEFT upper lobe. No pneumothorax or acute osseous finding. IMPRESSION: Again identified large RIGHT superior mediastinal mass corresponding to extension of a RIGHT thyroid mass into the anterior mediastinum. Small RIGHT pleural effusion  with bibasilar atelectasis. Enlargement of cardiac silhouette. Electronically Signed   By: Lavonia Dana M.D.   On: 01/21/2019 15:40    Pending Labs Unresulted Labs (From admission, onward)  Start     Ordered   01/21/19 2145  Gastrointestinal Panel by PCR , Stool  (Gastrointestinal Panel by PCR, Stool)  Once,   STAT     01/21/19 2144   01/21/19 2145  C difficile quick scan w PCR reflex  (C Difficile quick screen w PCR reflex panel)  Once, for 24 hours,   STAT     01/21/19 2144   01/21/19 1953  T3, free  Add-on,   AD     01/21/19 1952   01/21/19 1953  T4  Add-on,   AD     01/21/19 1952   01/21/19 1507  Urinalysis, Routine w reflex microscopic  ONCE - STAT,   STAT     01/21/19 1509   Signed and Held  Magnesium  Tomorrow morning,   R    Comments: Call MD if <1.5    Signed and Held   Signed and Held  Phosphorus  Tomorrow morning,   R     Signed and Held   Signed and Held  TSH  Once,   R    Comments: Cancel if already done within 1 month and notify MD    Signed and Held   Signed and Held  Comprehensive metabolic panel  Once,   R    Comments: Cal MD for K<3.5 or >5.0    Signed and Held   Signed and Held  CBC  Once,   R    Comments: Call for hg <8.0    Signed and Held          Vitals/Pain Today's Vitals   01/21/19 2018 01/21/19 2045 01/21/19 2100 01/21/19 2120  BP: 133/77 121/90 131/71 (!) 143/92  Pulse: (!) 41 (!) 29 (!) 39 79  Resp: 15 (!) 21 17 14   Temp:      TempSrc:      SpO2: 95% 95% 94% 95%  Weight:      Height:      PainSc:        Isolation Precautions Enteric precautions (UV disinfection)  Medications Medications  iohexol (OMNIPAQUE) 300 MG/ML solution 100 mL (100 mLs Intravenous Contrast Given 01/21/19 1703)    Mobility walks Low fall risk   Focused Assessments    R Recommendations: See Admitting Provider Note  Report given to:   Additional Notes:

## 2019-01-21 NOTE — ED Triage Notes (Signed)
Pt here for abdominal pain since this morning. Pts HR was also noted to be in the 30s in triage.

## 2019-01-21 NOTE — ED Notes (Signed)
Pts HR is now in the 80s on the heart monitor

## 2019-01-21 NOTE — H&P (Signed)
Kevin Potter TGY:[id number] DOB: [birth year] DOA: 01/21/2019     PCP: Crist Infante, MD   Outpatient Specialists:   CARDS:  Dr. Martinique   NEurology  Dr. Rexene Alberts    Patient arrived to ER on 01/21/19 at 1431  Patient coming from: home Lives   With family Wife    Chief Complaint: Abdominal discomfort  HPI: Kevin Potter is a 85 y.o. male with medical history significant of a. Flutter on chronic coagulation, mild dementia, first-degree AV block, BPH, GERD, HTN, hyperthyroidism   Chronic tremor and unstable gait, venous insufficiency of chronic lower extremity edema not tolerant of Lasix  Presented with decreased p.o. intake abdominal discomfort since this morning Been having generalized fatigue is feeling unwell all together.  He is feeling lightheaded when he stands up although no vertigo he has trouble walking at baseline which is chronic for him.  He felt like maybe his speech was difficult to understand yesterday but was mainly generalized secondary to generalized fatigue reports abdominal pain is epigastric nothing seems to make it better he has only small bowel movements no diarrhea no nausea no vomiting. NO Fevers or chills no sick contacts Is been having chronic lower extremity edema which is gotten a bit worse lately. He is on chronic anticoagulation with Eliquis and continues to take it.  Per Wife no recent antibiotics recently, He have had some fruit salads Family had no similar symptoms.   Infectious risk factors:  Reports abdominal pain, severe fatigue   In  ER RAPID COVID TEST NEGATIVE       Regarding pertinent Chronic problems:      HTN on propranolol   CHF  Systolic  - last echo In 2876 he did have a Myoview study which showed no ischemia and an ejection fraction of 47%. Echocardiogram at that time also showed an ejection fraction of 45-50% In 2008 he did have a Myoview study which showed no ischemia and an ejection fraction of 47%. Echocardiogram at that time also  showed an ejection fraction of 45-50%     Hyperthyroidism:  Lab Results  Component Value Date   TSH 0.169 (L) 01/21/2019        chronic A. Flutter -  - CHA2DS2 vas score  4:  current  on anticoagulation with  Eliquis,           -  Rate control:  Currently controlled with propranolol          - Rhythm control: Intolerant to Rythmol and failed on past cardioversion back in 2008   While in ER:  noted to have heart rate down to 30s while being evaluated in triage Improved up to 80 on the heart monitor The following Work up has been ordered so far:  Orders Placed This Encounter  Procedures   SARS Coronavirus 2 [facility] order, Performed in [place] hospital lab) Nasopharyngeal Nasopharyngeal Swab   Gastrointestinal Panel by PCR , Stool   C difficile quick scan w PCR reflex   DG Chest Portable 1 View   CT Head Wo Contrast   CT ABDOMEN PELVIS W CONTRAST   Comprehensive metabolic panel   Lipase, blood   CBC with Differential   TSH   Magnesium   Urinalysis, Routine w reflex microscopic   Brain natriuretic peptide   T3, free   T4   Cardiac monitoring   Cardiac monitoring   Place TED hose   Consult to hospitalist  ALL PATIENTS BEING ADMITTED/HAVING PROCEDURES NEED COVID-19  SCREENING   Nutritional services consult   Enteric precautions (UV disinfection)   ED EKG   EKG 12-Lead   Saline lock IV   Place in observation (patient's expected length of stay will be less than 2 midnights)    Following Medications were ordered in ER: Medications  iohexol (OMNIPAQUE) 300 MG/ML solution 100 mL (100 mLs Intravenous Contrast Given 01/21/19 1703)        Consult Orders  (From admission, onward)         Start     Ordered   01/21/19 2148  Nutritional services consult  Once    Provider:  (Not yet assigned)  Question:  Reason for Consult?  Answer:  malnutrition   01/21/19 2147   01/21/19 1842  Consult to hospitalist  ALL PATIENTS BEING ADMITTED/HAVING  PROCEDURES NEED COVID-19 SCREENING  Once    Comments: ALL PATIENTS BEING ADMITTED/HAVING PROCEDURES NEED COVID-19 SCREENING  Provider:  (Not yet assigned)  Question Answer Comment  Place call to: [facility]   Reason for Consult Admit      01/21/19 1841           Significant initial  Findings: Abnormal Labs Reviewed  COMPREHENSIVE METABOLIC PANEL - Abnormal; Notable for the following components:      Result Value   Glucose, Bld 107 (*)    AST 14 (*)    Total Bilirubin 2.8 (*)    All other components within normal limits  CBC WITH DIFFERENTIAL/PLATELET - Abnormal; Notable for the following components:   Lymphs Abs 0.6 (*)    All other components within normal limits  TSH - Abnormal; Notable for the following components:   TSH 0.169 (*)    All other components within normal limits  BRAIN NATRIURETIC PEPTIDE - Abnormal; Notable for the following components:   B Natriuretic Peptide 325.8 (*)    All other components within normal limits     Otherwise labs showing:    Recent Labs  Lab 01/21/19 1507  NA 139  K 4.0  CO2 29  GLUCOSE 107*  BUN 12  CREATININE 0.89  CALCIUM 9.5  MG 1.9    Cr   Stable,  Lab Results  Component Value Date   CREATININE 0.89 01/21/2019   CREATININE 0.87 10/25/2017   CREATININE 0.68 02/05/2017    Recent Labs  Lab 01/21/19 1507  AST 14*  ALT 13  ALKPHOS 93  BILITOT 2.8*  PROT 6.7  ALBUMIN 3.7   Lab Results  Component Value Date   CALCIUM 9.5 01/21/2019       WBC      Component Value Date/Time   WBC 6.0 01/21/2019 1507   ANC    Component Value Date/Time   NEUTROABS 4.8 01/21/2019 1507   ALC 0.6   Plt: Lab Results  Component Value Date   PLT 161 01/21/2019    Lactic Acid, Venous No results found for: LATICACIDVEN     COVID-19 Labs  No results for input(s): DDIMER, FERRITIN, LDH, CRP in the last 72 hours.  Lab Results  Component Value Date   [place] NEGATIVE 01/21/2019         HG/HCT  stable,        Component Value Date/Time   HGB 14.2 01/21/2019 1507   HCT 43.6 01/21/2019 1507    Recent Labs  Lab 01/21/19 1507  LIPASE 26      Troponin 16 Cardiac Panel (last 3 results) No results for input(s): CKTOTAL, CKMB, TROPONINI, RELINDX in the last 72  hours.     BNP (last 3 results) Recent Labs    01/21/19 1507  BNP 325.8*    ProBNP (last 3 results) No results for input(s): PROBNP in the last 8760 hours.  DM  labs:  HbA1C: No results for input(s): HGBA1C in the last 8760 hours.     CBG (last 3)  No results for input(s): GLUCAP in the last 72 hours.     UA    ordered    CT HEAD  NON acute  CXR - RIGHT superior mediastinal mass corresponding to extension of a RIGHT thyroid mass into the anterior mediastinum.  CTabd/pelvis -Dilated small bowel loops extending to distal ileum where minimal wall thickening is seen, question ileus versus low-grade small-bowel obstruction   ECG:  Personally reviewed by me showing: HR : 84 Rhythm: A.lutter .   Ischemic changes  QTC 438      ED Triage Vitals  Enc Vitals Group     BP 01/21/19 1432 131/63     Pulse Rate 01/21/19 1432 (!) 32     Resp 01/21/19 1432 12     Temp 01/21/19 1432 98.6 F (37 C)     Temp Source 01/21/19 1432 Oral     SpO2 01/21/19 1432 97 %     Weight 01/21/19 1438 170 lb (77.1 kg)     Height 01/21/19 1438 6\' 1"  (1.854 m)     Head Circumference --      Peak Flow --      Pain Score 01/21/19 1438 8     Pain Loc --      Pain Edu? --      Excl. in [place]? --   TMAX(24)@       Latest  Blood pressure (!) 146/94, pulse 78, temperature 98.8 F (37.1 C), temperature source Oral, resp. rate 14, height 6\' 1"  (1.854 m), weight 77.1 kg, SpO2 93 %.    Hospitalist was called for admission for ileus, dehydration   Review of Systems:    Pertinent positives include:  Fatigue, abdominal pain,  Constitutional:  No weight loss, night sweats, Fevers, chills, weight loss  HEENT:  No headaches, Difficulty  swallowing,Tooth/dental problems,Sore throat,  No sneezing, itching, ear ache, nasal congestion, post nasal drip,  Cardio-vascular:  No chest pain, Orthopnea, PND, anasarca, dizziness, palpitations.no Bilateral lower extremity swelling  GI:  No heartburn, indigestion,  nausea, vomiting, diarrhea, change in bowel habits, loss of appetite, melena, blood in stool, hematemesis Resp:  no shortness of breath at rest. No dyspnea on exertion, No excess mucus, no productive cough, No non-productive cough, No coughing up of blood.No change in color of mucus.No wheezing. Skin:  no rash or lesions. No jaundice GU:  no dysuria, change in color of urine, no urgency or frequency. No straining to urinate.  No flank pain.  Musculoskeletal:  No joint pain or no joint swelling. No decreased range of motion. No back pain.  Psych:  No change in mood or affect. No depression or anxiety. No memory loss.  Neuro: no localizing neurological complaints, no tingling, no weakness, no double vision, no gait abnormality, no slurred speech, no confusion  All systems reviewed and apart from [place] all are negative  Past Medical History:   Past Medical History:  Diagnosis Date   1st degree AV block    Allergy    Arthritis    right foot ,    Atrial flutter (HCC)    BPH (benign prostatic hyperplasia)    Chicken pox  Chronic anticoagulation    Colon polyps    Colonic adenoma    Diverticulosis    Erectile dysfunction    Gallstones    GERD (gastroesophageal reflux disease)    Gilbert's syndrome    Goiter    Hemorrhoids    Hiatal hernia    Hypertension    Hyperthyroidism    Hypothyroidism    Sleep apnea    no cpap now- lost weight    Tremor       Past Surgical History:  Procedure Laterality Date   APPENDECTOMY     CARDIOVERSION  03/15/2007   elective DC/x1 with joint pads in the anterior & posterior position/no complications   COLONOSCOPY     left ankle surgery     NASAL  SEPTUM SURGERY     PALATE / UVULA BIOPSY / EXCISION     POLYPECTOMY     TONSILLECTOMY     with uvula removed   UVA      Social History:  Ambulatory walker      reports that he has never smoked. He has never used smokeless tobacco. He reports current alcohol use. He reports that he does not use drugs.     Family History:   Family History  Problem Relation Age of Onset   Heart disease Father    Cancer Mother        unknown type   Heart disease Mother    Cancer Sister        unknown type   Heart disease Brother    Colon cancer Neg Hx    Colon polyps Neg Hx    Esophageal cancer Neg Hx    Rectal cancer Neg Hx    Stomach cancer Neg Hx     Allergies: No Known Allergies   Prior to Admission medications   Medication Sig Start Date End Date Taking? Authorizing Provider  alfuzosin (UROXATRAL) 10 MG 24 hr tablet  11/27/17   [provider]  apixaban (ELIQUIS) 5 MG TABS tablet Take 1 tablet (5 mg total) by mouth 2 (two) times daily. 09/24/18   Martinique, Peter M, MD  fluticasone [facility]) 50 MCG/ACT nasal spray Place 1 spray into the nose as needed.  09/12/11   [provider]  LORazepam (ATIVAN) 1 MG tablet Take 1 mg by mouth daily.      [provider]  omeprazole (PRILOSEC) 40 MG capsule Take 1 capsule (40 mg total) by mouth 2 (two) times daily. Patient taking differently: Take 80 mg by mouth 2 (two) times daily.  03/22/17   Levin Erp, PA  propranolol (INDERAL) 20 MG tablet TAKE ONE TABLET BY MOUTH THREE TIMES A DAY 11/26/18   Star Age, MD  tamsulosin (FLOMAX) 0.4 MG CAPS capsule  02/10/17   [provider]  trihexyphenidyl (ARTANE) 2 MG tablet Take 1 tablet (2 mg total) by mouth 2 (two) times daily with a meal. 05/24/18   Star Age, MD   Physical Exam: Blood pressure (!) 146/94, pulse 78, temperature 98.8 F (37.1 C), temperature source Oral, resp. rate 14, height 6\' 1"  (1.854 m), weight 77.1 kg, SpO2 93 %. 1.  General:  in No Acute distress   Chronically ill  -appearing 2. Psychological: Alert and  Oriented 3. Head/ENT:   Moist  Mucous Membranes                          Head Non traumatic, neck supple  Poor Dentition 4. SKIN:   decreased Skin turgor,  Skin clean Dry and intact no rash 5. Heart: Regular rate and rhythm no  Murmur, no Rub or gallop 6. Lungs:  no wheezes or crackles   7. Abdomen: Soft,  non-tender, Non distended bowel sounds present 8. Lower extremities: no clubbing, cyanosis, R>L edema 9. Neurologically Grossly intact, moving all 4 extremities equally  10. MSK: Normal range of motion   All other LABS:     Recent Labs  Lab 01/21/19 1507  WBC 6.0  NEUTROABS 4.8  HGB 14.2  HCT 43.6  MCV 95.6  PLT 161     Recent Labs  Lab 01/21/19 1507  NA 139  K 4.0  CL 101  CO2 29  GLUCOSE 107*  BUN 12  CREATININE 0.89  CALCIUM 9.5  MG 1.9     Recent Labs  Lab 01/21/19 1507  AST 14*  ALT 13  ALKPHOS 93  BILITOT 2.8*  PROT 6.7  ALBUMIN 3.7       Cultures: No results found for: SDES, SPECREQUEST, CULT, REPTSTATUS   Radiological Exams on Admission: Ct Head Wo Contrast  Result Date: 01/21/2019 CLINICAL DATA:  Generalized weakness EXAM: CT HEAD WITHOUT CONTRAST TECHNIQUE: Contiguous axial images were obtained from the base of the skull through the vertex without intravenous contrast. COMPARISON:  07/21/2018 MR brain FINDINGS: Brain: No evidence of acute infarction, hemorrhage, extra-axial collection, ventriculomegaly, or mass effect. Generalized cerebral atrophy. Periventricular white matter low attenuation likely secondary to microangiopathy. Vascular: Cerebrovascular atherosclerotic calcifications are noted. Skull: Negative for fracture or focal lesion. Sinuses/Orbits: Visualized portions of the orbits are unremarkable. Visualized portions of the paranasal sinuses and mastoid air cells are unremarkable. Other: None. IMPRESSION: No acute  intracranial pathology. Electronically Signed   By: Kathreen Devoid   On: 01/21/2019 17:25   Ct Abdomen Pelvis W Contrast  Result Date: 01/21/2019 CLINICAL DATA:  Diffuse abdominal pain since this morning, history stroke, GERD, hypertension, diverticulosis, atrial flutter EXAM: CT ABDOMEN AND PELVIS WITH CONTRAST TECHNIQUE: Multidetector CT imaging of the abdomen and pelvis was performed using the standard protocol following bolus administration of intravenous contrast. Sagittal and coronal MPR images reconstructed from axial data set. CONTRAST:  169mL OMNIPAQUE IOHEXOL 300 MG/ML SOLN IV. No oral contrast. COMPARISON:  None FINDINGS: Lower chest: BILATERAL pleural effusions, moderate RIGHT and small LEFT. Bibasilar atelectasis. Calcified granulomata in the lower lobes. Enlargement of cardiac chambers with small pericardial effusion. Hepatobiliary: Multiple dependent calculi within gallbladder. No biliary dilatation. Liver otherwise normal appearance Pancreas: Atrophic, otherwise unremarkable Spleen: Calcified granulomata, otherwise normal appearance Adrenals/Urinary Tract: Adrenal glands normal appearance. BILATERAL peripelvic renal cysts. Kidneys otherwise normal appearance. Ureters and bladder unremarkable. Stomach/Bowel: Sigmoid and descending colonic diverticulosis without evidence of diverticulitis. Fluid in RIGHT colon. Colon otherwise decompressed. Stomach mildly distended by fluid and air. Multiple dilated small bowel loops throughout the abdomen and pelvis with distal ileum decompressed. Short segment of distal ileum shows questionable mild wall thickening. Findings could represent ileus or low-grade small bowel obstruction. Vascular/Lymphatic: Atherosclerotic calcifications of aorta and iliac arteries. Major vascular structures appear patent. Reproductive: Prostatic enlargement, gland 6.4 x 5.7 x 7.6 cm (volume = 150 cm^3) Other: Free fluid in pelvis and between bowel loops. No definite free air. No  hernia. Musculoskeletal: Diffuse osseous demineralization. Degenerative changes of the hip joints. Scattered degenerative disc disease changes lumbar spine. Osseous demineralization. IMPRESSION: Dilated small bowel loops extending to distal ileum where minimal wall thickening is seen, question ileus versus low-grade small-bowel  obstruction. Small amounts of scattered free fluid without evidence of free air or perforation. Marked prostatic enlargement. BILATERAL peripelvic renal cysts. Bibasilar pleural effusions and atelectasis RIGHT greater than LEFT. Small pericardial effusion with enlargement of cardiac chambers. Electronically Signed   By: Lavonia Dana M.D.   On: 01/21/2019 17:34   Dg Chest Portable 1 View  Result Date: 01/21/2019 CLINICAL DATA:  Short of breath EXAM: PORTABLE CHEST 1 VIEW COMPARISON:  Portable exam 1524 hours compared to 02/05/2017 Correlation: CT chest 08/03/2017 FINDINGS: Enlargement of cardiac silhouette. Large RIGHT paratracheal mass extending from the level of the clavicular heads to the RIGHT hilum, measuring 12.1 x 9.9 cm in size, exerting significant RIGHT to LEFT mass effect upon the trachea with tracheal narrowing. 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 Small RIGHT pleural effusion with bibasilar atelectasis. Upper lungs clear. Calcified granuloma LEFT upper lobe. No pneumothorax or acute osseous finding. IMPRESSION: Again identified large RIGHT superior mediastinal mass corresponding to extension of a RIGHT thyroid mass into the anterior mediastinum. Small RIGHT pleural effusion with bibasilar atelectasis. Enlargement of cardiac silhouette. Electronically Signed   By: Lavonia Dana M.D.   On: 01/21/2019 15:40    Chart has been reviewed    Assessment/Plan  85 y.o. male with medical history significant of a. Flutter on chronic coagulation, mild dementia, first-degree AV block, BPH, GERD, HTN, hyperthyroidism   Chronic tremor and  unstable gait, venous insufficiency of chronic lower extremity edema not tolerant of Lasix Admitted for ileus, diarrhea dehydration  Present on Admission:   Ileus ([place]) - bowel rest, reasses   Diarrhea - order gastro panel, check c.dif   Atrial flutter ([place]) -         - CHA2DS2 vas score 4 : continue current anticoagulation with Eliquis,           -  Rate control:  Currently controlled with propranolol given initial bradycardia will hold for tonight     Hypertension - allow permissive HTn    Hyperthyroidism - check TSH, t3, t4   Chronic systolic CHF (congestive heart failure) (HCC) -- currently appears to be slightly on the dry side, carefuly follow fluid status and Cr    Debility - PT/OT assement  Right leg swelling - will check doppler  Other plan as per orders.  DVT prophylaxis:  eliquis   Code Status:  DNR/DNI  as per family  I had personally discussed CODE STATUS with    Family     Family Communication:   Family not at  Bedside  plan of care was discussed on the phone with  Wife,   Disposition Plan:     To home once workup is complete and patient is stable                     Would benefit from PT/OT eval prior to DC  Ordered                                       Consults called: none   Admission status:  ED Disposition    ED Disposition Condition [place]: [place] [[id number]]  Level of Care: Telemetry Medical [104]  I expect the patient will be discharged within 24 hours: No (not a candidate for 5C-Observation unit)  Covid Evaluation: Asymptomatic Screening Protocol (No  Symptoms)  Diagnosis: Ileus ([place]) [[id number]]  Admitting Physician: Toy Baker [[id number]]  Attending Physician: Toy Baker [[id number]]  PT Class (Do Not Modify): Observation [104]  PT Acc Code (Do Not Modify): Observation [10022]         Obs    Level of care     tele  For 12H    Precautions:  enteric,   Enteric precautions (UV  disinfection)  PPE: Used by the provider:   P100  eye Goggles,  Gloves  gown    Safire Gordin 01/21/2019, 10:26 PM    [facility]     after 2 AM please page floor coverage PA If 7AM-7PM, please contact the day team taking care of the patient using [email or web address]

## 2019-01-21 NOTE — ED Notes (Signed)
Pt had several loose bowel movements. Pt was cleaned and the bed was changed with new linen. Pt given new gown and placed in a brief.

## 2019-01-21 NOTE — ED Provider Notes (Signed)
Emergency Department Provider Note   I have reviewed the triage vital signs and the nursing notes.   HISTORY  Chief Complaint No chief complaint on file.   HPI Kevin Potter is a 85 y.o. male with PMH reviewed below including mild report of dementia symptoms presents to the emergency department for evaluation of multiple symptoms including abdominal pain, generalized weakness, feeling "out of it" and "like I had a stroke" yesterday.  Patient states that he developed abdominal discomfort and a more generalized type weakness.  He says he felt very out of it and lightheaded if he stands up.  He denies vertigo.  His vision is relatively poor at baseline and not significantly changed in the last 48 hours.  He did feel like maybe his speech was different yesterday as well.  His abdominal pain is mostly mid epigastric and nonradiating.  No clear modifying factors.  He has been having bowel movements.  Denies diarrhea or vomiting.  No heart palpitations.  He was noted in triage to have a heart rate in the 30s but this was not documented on telemetry or EKG. No near syncope. No medication changes. Patient is anticoagulated. He does note bilateral LE edema worsening over the last several weeks.    Level 5 caveat: dementia  Past Medical History:  Diagnosis Date   1st degree AV block    Allergy    Arthritis    right foot ,    Atrial flutter (HCC)    BPH (benign prostatic hyperplasia)    Chicken pox    Chronic anticoagulation    Colon polyps    Colonic adenoma    Diverticulosis    Erectile dysfunction    Gallstones    GERD (gastroesophageal reflux disease)    Gilbert's syndrome    Goiter    Hemorrhoids    Hiatal hernia    Hypertension    Hyperthyroidism    Hypothyroidism    Sleep apnea    no cpap now- lost weight    Tremor     Patient Active Problem List   Diagnosis Date Noted   Hyperthyroidism 28/78/6767   Chronic systolic CHF (congestive heart failure)  ([place]) 01/21/2019   Debility 01/21/2019   Ileus ([place]) 01/21/2019   Diarrhea 01/21/2019   Closed dislocation finger, proximal interphalangeal joint, traumatic 02/06/2018   Dyspnea 12/14/2012   Atrial flutter (HCC)    Chronic anticoagulation    Hypertension     Past Surgical History:  Procedure Laterality Date   APPENDECTOMY     CARDIOVERSION  03/15/2007   elective DC/x1 with joint pads in the anterior & posterior position/no complications   COLONOSCOPY     left ankle surgery     NASAL SEPTUM SURGERY     PALATE / UVULA BIOPSY / EXCISION     POLYPECTOMY     TONSILLECTOMY     with uvula removed   UVA      Allergies Patient has no known allergies.  Family History  Problem Relation Age of Onset   Heart disease Father    Cancer Mother        unknown type   Heart disease Mother    Cancer Sister        unknown type   Heart disease Brother    Colon cancer Neg Hx    Colon polyps Neg Hx    Esophageal cancer Neg Hx    Rectal cancer Neg Hx    Stomach cancer Neg Hx  Social History Social History   Tobacco Use   Smoking status: Never Smoker   Smokeless tobacco: Never Used  Substance Use Topics   Alcohol use: Yes    Alcohol/week: 0.0 standard drinks    Comment: red wine at night   Drug use: No    Review of Systems  Constitutional: No fever/chills. Positive generalized weakness.  Eyes: No visual changes. ENT: No sore throat. Cardiovascular: Denies chest pain. Respiratory: Positive shortness of breath with laying flat only. Gastrointestinal: Positive mid-epigastric abdominal pain.  No nausea, no vomiting.  No diarrhea.  No constipation. Genitourinary: Negative for dysuria. Musculoskeletal: Negative for back pain. Skin: Negative for rash. Neurological: Negative for headaches, focal weakness or numbness.  10-point ROS otherwise negative.  ____________________________________________   PHYSICAL EXAM:  VITAL SIGNS: ED Triage  Vitals  Enc Vitals Group     BP 01/21/19 1432 131/63     Pulse Rate 01/21/19 1432 (!) 32     Resp 01/21/19 1432 12     Temp 01/21/19 1432 98.6 F (37 C)     Temp Source 01/21/19 1432 Oral     SpO2 01/21/19 1432 97 %     Weight 01/21/19 1438 170 lb (77.1 kg)     Height 01/21/19 1438 6\' 1"  (1.854 m)   Constitutional: Alert and oriented. Well appearing and in no acute distress. Eyes: Conjunctivae are normal. PERRL. Head: Atraumatic. Nose: No congestion/rhinnorhea. Mouth/Throat: Mucous membranes are moist.  Neck: No stridor. Cardiovascular: Irregularly irregular. Good peripheral circulation. Grossly normal heart sounds.   Respiratory: Normal respiratory effort.  No retractions. Lungs CTAB. Gastrointestinal: Soft with mild mid-epigastric tenderness. No distention.  Musculoskeletal: No lower extremity tenderness nor edema. No gross deformities of extremities. Neurologic:  Normal speech and language. No gross focal neurologic deficits are appreciated.  No pronator drift.  Mild tremor in the right upper extremity but improves with intention.  Patient is able to resist gravity with his lower extremities.  No unilateral weakness but more generalized presentation.  Skin:  Skin is warm, dry and intact. No rash noted.  ____________________________________________   LABS (all labs ordered are listed, but only abnormal results are displayed)  Labs Reviewed  COMPREHENSIVE METABOLIC PANEL - Abnormal; Notable for the following components:      Result Value   Glucose, Bld 107 (*)    AST 14 (*)    Total Bilirubin 2.8 (*)    All other components within normal limits  CBC WITH DIFFERENTIAL/PLATELET - Abnormal; Notable for the following components:   Lymphs Abs 0.6 (*)    All other components within normal limits  TSH - Abnormal; Notable for the following components:   TSH 0.169 (*)    All other components within normal limits  URINALYSIS, ROUTINE W REFLEX MICROSCOPIC - Abnormal; Notable for the  following components:   Specific Gravity, Urine 1.039 (*)    Ketones, ur 5 (*)    All other components within normal limits  BRAIN NATRIURETIC PEPTIDE - Abnormal; Notable for the following components:   B Natriuretic Peptide 325.8 (*)    All other components within normal limits  TSH - Abnormal; Notable for the following components:   TSH 0.115 (*)    All other components within normal limits  COMPREHENSIVE METABOLIC PANEL - Abnormal; Notable for the following components:   Glucose, Bld 115 (*)    Total Protein 5.9 (*)    Albumin 3.1 (*)    AST 14 (*)    Total Bilirubin 2.5 (*)  All other components within normal limits  SARS CORONAVIRUS 2 (HOSPITAL ORDER, [place] [facility])  GASTROINTESTINAL PANEL BY PCR, STOOL (REPLACES STOOL CULTURE)  LIPASE, BLOOD  MAGNESIUM  MAGNESIUM  PHOSPHORUS  CBC  T3, FREE  T4  TROPONIN I (HIGH SENSITIVITY)  TROPONIN I (HIGH SENSITIVITY)   ____________________________________________  EKG   EKG Interpretation  Date/Time:  Monday January 21 2019 14:42:53 EDT Ventricular Rate:  84 PR Interval:    QRS Duration: 175 QT Interval:  427 QTC Calculation: 438 R Axis:   79 Text Interpretation:  Atrial flutter IVCD, consider atypical RBBB LVH with secondary repolarization abnormality No STEMI. Similar to Sep 2018 tracing.  Confirmed by Nanda Quinton [phone] on 01/21/2019 4:13:39 PM       ____________________________________________  RADIOLOGY  Ct Head Wo Contrast  Result Date: 01/21/2019 CLINICAL DATA:  Generalized weakness EXAM: CT HEAD WITHOUT CONTRAST TECHNIQUE: Contiguous axial images were obtained from the base of the skull through the vertex without intravenous contrast. COMPARISON:  07/21/2018 MR brain FINDINGS: Brain: No evidence of acute infarction, hemorrhage, extra-axial collection, ventriculomegaly, or mass effect. Generalized cerebral atrophy. Periventricular white matter low attenuation likely secondary to  microangiopathy. Vascular: Cerebrovascular atherosclerotic calcifications are noted. Skull: Negative for fracture or focal lesion. Sinuses/Orbits: Visualized portions of the orbits are unremarkable. Visualized portions of the paranasal sinuses and mastoid air cells are unremarkable. Other: None. IMPRESSION: No acute intracranial pathology. Electronically Signed   By: Kathreen Devoid   On: 01/21/2019 17:25   Ct Abdomen Pelvis W Contrast  Result Date: 01/21/2019 CLINICAL DATA:  Diffuse abdominal pain since this morning, history stroke, GERD, hypertension, diverticulosis, atrial flutter EXAM: CT ABDOMEN AND PELVIS WITH CONTRAST TECHNIQUE: Multidetector CT imaging of the abdomen and pelvis was performed using the standard protocol following bolus administration of intravenous contrast. Sagittal and coronal MPR images reconstructed from axial data set. CONTRAST:  172mL OMNIPAQUE IOHEXOL 300 MG/ML SOLN IV. No oral contrast. COMPARISON:  None FINDINGS: Lower chest: BILATERAL pleural effusions, moderate RIGHT and small LEFT. Bibasilar atelectasis. Calcified granulomata in the lower lobes. Enlargement of cardiac chambers with small pericardial effusion. Hepatobiliary: Multiple dependent calculi within gallbladder. No biliary dilatation. Liver otherwise normal appearance Pancreas: Atrophic, otherwise unremarkable Spleen: Calcified granulomata, otherwise normal appearance Adrenals/Urinary Tract: Adrenal glands normal appearance. BILATERAL peripelvic renal cysts. Kidneys otherwise normal appearance. Ureters and bladder unremarkable. Stomach/Bowel: Sigmoid and descending colonic diverticulosis without evidence of diverticulitis. Fluid in RIGHT colon. Colon otherwise decompressed. Stomach mildly distended by fluid and air. Multiple dilated small bowel loops throughout the abdomen and pelvis with distal ileum decompressed. Short segment of distal ileum shows questionable mild wall thickening. Findings could represent ileus or  low-grade small bowel obstruction. Vascular/Lymphatic: Atherosclerotic calcifications of aorta and iliac arteries. Major vascular structures appear patent. Reproductive: Prostatic enlargement, gland 6.4 x 5.7 x 7.6 cm (volume = 150 cm^3) Other: Free fluid in pelvis and between bowel loops. No definite free air. No hernia. Musculoskeletal: Diffuse osseous demineralization. Degenerative changes of the hip joints. Scattered degenerative disc disease changes lumbar spine. Osseous demineralization. IMPRESSION: Dilated small bowel loops extending to distal ileum where minimal wall thickening is seen, question ileus versus low-grade small-bowel obstruction. Small amounts of scattered free fluid without evidence of free air or perforation. Marked prostatic enlargement. BILATERAL peripelvic renal cysts. Bibasilar pleural effusions and atelectasis RIGHT greater than LEFT. Small pericardial effusion with enlargement of cardiac chambers. Electronically Signed   By: Lavonia Dana M.D.   On: 01/21/2019 17:34   Dg Chest  Portable 1 View  Result Date: 01/21/2019 CLINICAL DATA:  Short of breath EXAM: PORTABLE CHEST 1 VIEW COMPARISON:  Portable exam 1524 hours compared to 02/05/2017 Correlation: CT chest 08/03/2017 FINDINGS: Enlargement of cardiac silhouette. Large RIGHT paratracheal mass extending from the level of the clavicular heads to the RIGHT hilum, measuring 12.1 x 9.9 cm in size, exerting significant RIGHT to LEFT mass effect upon the trachea with tracheal narrowing. 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 Small RIGHT pleural effusion with bibasilar atelectasis. Upper lungs clear. Calcified granuloma LEFT upper lobe. No pneumothorax or acute osseous finding. IMPRESSION: Again identified large RIGHT superior mediastinal mass corresponding to extension of a RIGHT thyroid mass into the anterior mediastinum. Small RIGHT pleural effusion with bibasilar atelectasis. Enlargement of  cardiac silhouette. Electronically Signed   By: Lavonia Dana M.D.   On: 01/21/2019 15:40    ____________________________________________   PROCEDURES  Procedure(s) performed:   Procedures   ____________________________________________   INITIAL IMPRESSION / ASSESSMENT AND PLAN / ED COURSE  Pertinent labs & imaging results that were available during my care of the patient were reviewed by me and considered in my medical decision making (see chart for details).   Patient presents to the emergency department for evaluation of abdominal pain with generalized weakness, possible speech change yesterday, shortness of breath with lying flat.  Symptoms are somewhat vague and difficult to connect clinically at this time.  I do not appreciate any focal neurologic deficit.  Patient does state that his "stroke like symptoms" have improved significantly today.  No family available by phone or at bedside to help corroborate any of these details.  He does have focal tenderness in the epigastric region without rebound or guarding.  Plan for CT imaging of the head and abdomen along with chest x-ray, labs, UA, heart monitoring.   CT with question ileus vs early SBO. Patient with continued pain. No vomiting. Remaining labs and Ct/X-ray imaging without acute findings.   Discussed patient's case with Hospitalist, Dr. Roel Cluck to request admission. Patient and family (if present) updated with plan. Care transferred to [facility] service.  I reviewed all nursing notes, vitals, pertinent old records, EKGs, labs, imaging (as available).  ____________________________________________  FINAL CLINICAL IMPRESSION(S) / ED DIAGNOSES  Final diagnoses:  Ileus ([place])  Generalized abdominal pain     MEDICATIONS GIVEN DURING THIS VISIT:  Medications  LORazepam (ATIVAN) tablet 1 mg (has no administration in time range)  pantoprazole (PROTONIX) EC tablet 40 mg (has no administration in time range)  alfuzosin (UROXATRAL) 24  hr tablet 10 mg (10 mg Oral Given 01/22/19 0835)  tamsulosin (FLOMAX) capsule 0.4 mg (has no administration in time range)  apixaban (ELIQUIS) tablet 5 mg (5 mg Oral Given 01/21/19 2336)  trihexyphenidyl (ARTANE) tablet 2 mg (2 mg Oral Given 01/22/19 0835)  acetaminophen (TYLENOL) tablet 650 mg (has no administration in time range)    Or  acetaminophen (TYLENOL) suppository 650 mg (has no administration in time range)  HYDROcodone-acetaminophen (NORCO/VICODIN) 5-325 MG per tablet 1-2 tablet (has no administration in time range)  ondansetron (ZOFRAN) tablet 4 mg (has no administration in time range)    Or  ondansetron (ZOFRAN) injection 4 mg (has no administration in time range)  0.9 %  sodium chloride infusion ( Intravenous Rate/Dose Verify 01/22/19 0300)  propranolol (INDERAL) tablet 20 mg (has no administration in time range)  iohexol (OMNIPAQUE) 300 MG/ML solution 100 mL (100 mLs Intravenous Contrast Given 01/21/19 1703)  Note:  This document was prepared using Dragon voice recognition software and may include unintentional dictation errors.  Nanda Quinton, MD Emergency Medicine    Meelah Tallo, [facility], MD 01/22/19 [phone]

## 2019-01-21 NOTE — ED Notes (Signed)
Pt had another large, loose watery bowel movement. Pt was cleaned and and new linens placed on bed. Pt changed into a new gown and call bell in reach.

## 2019-01-22 ENCOUNTER — Observation Stay (HOSPITAL_BASED_OUTPATIENT_CLINIC_OR_DEPARTMENT_OTHER): Payer: Medicare Other

## 2019-01-22 DIAGNOSIS — R609 Edema, unspecified: Secondary | ICD-10-CM | POA: Diagnosis not present

## 2019-01-22 DIAGNOSIS — I4892 Unspecified atrial flutter: Secondary | ICD-10-CM | POA: Diagnosis present

## 2019-01-22 DIAGNOSIS — Z20828 Contact with and (suspected) exposure to other viral communicable diseases: Secondary | ICD-10-CM | POA: Diagnosis present

## 2019-01-22 DIAGNOSIS — Z7901 Long term (current) use of anticoagulants: Secondary | ICD-10-CM | POA: Diagnosis not present

## 2019-01-22 DIAGNOSIS — K219 Gastro-esophageal reflux disease without esophagitis: Secondary | ICD-10-CM | POA: Diagnosis present

## 2019-01-22 DIAGNOSIS — I5022 Chronic systolic (congestive) heart failure: Secondary | ICD-10-CM | POA: Diagnosis present

## 2019-01-22 DIAGNOSIS — K529 Noninfective gastroenteritis and colitis, unspecified: Secondary | ICD-10-CM | POA: Diagnosis present

## 2019-01-22 DIAGNOSIS — I440 Atrioventricular block, first degree: Secondary | ICD-10-CM | POA: Diagnosis present

## 2019-01-22 DIAGNOSIS — Z66 Do not resuscitate: Secondary | ICD-10-CM | POA: Diagnosis present

## 2019-01-22 DIAGNOSIS — Z809 Family history of malignant neoplasm, unspecified: Secondary | ICD-10-CM | POA: Diagnosis not present

## 2019-01-22 DIAGNOSIS — I110 Hypertensive heart disease with heart failure: Secondary | ICD-10-CM | POA: Diagnosis present

## 2019-01-22 DIAGNOSIS — R2681 Unsteadiness on feet: Secondary | ICD-10-CM | POA: Diagnosis present

## 2019-01-22 DIAGNOSIS — E876 Hypokalemia: Secondary | ICD-10-CM | POA: Diagnosis not present

## 2019-01-22 DIAGNOSIS — Z8249 Family history of ischemic heart disease and other diseases of the circulatory system: Secondary | ICD-10-CM | POA: Diagnosis not present

## 2019-01-22 DIAGNOSIS — F0390 Unspecified dementia without behavioral disturbance: Secondary | ICD-10-CM | POA: Diagnosis present

## 2019-01-22 DIAGNOSIS — E0500 Thyrotoxicosis with diffuse goiter without thyrotoxic crisis or storm: Secondary | ICD-10-CM | POA: Diagnosis present

## 2019-01-22 DIAGNOSIS — R1013 Epigastric pain: Secondary | ICD-10-CM | POA: Diagnosis present

## 2019-01-22 DIAGNOSIS — R251 Tremor, unspecified: Secondary | ICD-10-CM | POA: Diagnosis present

## 2019-01-22 DIAGNOSIS — R5381 Other malaise: Secondary | ICD-10-CM | POA: Diagnosis not present

## 2019-01-22 DIAGNOSIS — N400 Enlarged prostate without lower urinary tract symptoms: Secondary | ICD-10-CM | POA: Diagnosis present

## 2019-01-22 DIAGNOSIS — R197 Diarrhea, unspecified: Secondary | ICD-10-CM | POA: Diagnosis not present

## 2019-01-22 DIAGNOSIS — R1084 Generalized abdominal pain: Secondary | ICD-10-CM | POA: Diagnosis not present

## 2019-01-22 DIAGNOSIS — Z515 Encounter for palliative care: Secondary | ICD-10-CM | POA: Diagnosis not present

## 2019-01-22 DIAGNOSIS — Z7189 Other specified counseling: Secondary | ICD-10-CM | POA: Diagnosis not present

## 2019-01-22 LAB — COMPREHENSIVE METABOLIC PANEL
ALT: 11 U/L (ref 0–44)
AST: 14 U/L — ABNORMAL LOW (ref 15–41)
Albumin: 3.1 g/dL — ABNORMAL LOW (ref 3.5–5.0)
Alkaline Phosphatase: 82 U/L (ref 38–126)
Anion gap: 6 (ref 5–15)
BUN: 15 mg/dL (ref 8–23)
CO2: 29 mmol/L (ref 22–32)
Calcium: 9 mg/dL (ref 8.9–10.3)
Chloride: 106 mmol/L (ref 98–111)
Creatinine, Ser: 0.8 mg/dL (ref 0.61–1.24)
GFR calc Af Amer: >60 mL/min (ref >60)
GFR calc non Af Amer: >60 mL/min (ref >60)
Glucose, Bld: 115 mg/dL — ABNORMAL HIGH (ref 70–99)
Potassium: 4.3 mmol/L (ref 3.5–5.1)
Sodium: 141 mmol/L (ref 135–145)
Total Bilirubin: 2.5 mg/dL — ABNORMAL HIGH (ref 0.3–1.2)
Total Protein: 5.9 g/dL — ABNORMAL LOW (ref 6.5–8.1)

## 2019-01-22 LAB — GASTROINTESTINAL PANEL BY PCR, STOOL (REPLACES STOOL CULTURE)

## 2019-01-22 LAB — TSH: TSH: 0.115 u[IU]/mL — ABNORMAL LOW (ref 0.350–4.500)

## 2019-01-22 LAB — URINALYSIS, ROUTINE W REFLEX MICROSCOPIC
Bilirubin Urine: NEGATIVE
Glucose, UA: NEGATIVE mg/dL
Hgb urine dipstick: NEGATIVE
Ketones, ur: 5 mg/dL — AB
Leukocytes,Ua: NEGATIVE
Nitrite: NEGATIVE
Protein, ur: NEGATIVE mg/dL
Specific Gravity, Urine: 1.039 — ABNORMAL HIGH (ref 1.005–1.030)
pH: 5 (ref 5.0–8.0)

## 2019-01-22 LAB — CBC
HCT: 40.2 % (ref 39.0–52.0)
Hemoglobin: 13.1 g/dL (ref 13.0–17.0)
MCH: 30.8 pg (ref 26.0–34.0)
MCHC: 32.6 g/dL (ref 30.0–36.0)
MCV: 94.6 fL (ref 80.0–100.0)
Platelets: 151 10*3/uL (ref 150–400)
RBC: 4.25 MIL/uL (ref 4.22–5.81)
RDW: 13.6 % (ref 11.5–15.5)
WBC: 6.7 10*3/uL (ref 4.0–10.5)
nRBC: 0 % (ref 0.0–0.2)

## 2019-01-22 LAB — PHOSPHORUS: Phosphorus: 3.3 mg/dL (ref 2.5–4.6)

## 2019-01-22 LAB — MAGNESIUM: Magnesium: 1.9 mg/dL (ref 1.7–2.4)

## 2019-01-22 MED ORDER — PROPRANOLOL HCL 20 MG PO TABS
20.0000 mg | ORAL_TABLET | Freq: Three times a day (TID) | ORAL | Status: DC
  Administered 2019-01-22 – 2019-01-27 (×15): 20 mg via ORAL
  Filled 2019-01-22 (×15): qty 1

## 2019-01-22 MED ORDER — ADULT MULTIVITAMIN W/MINERALS CH
1.0000 | ORAL_TABLET | Freq: Every day | ORAL | Status: DC
  Administered 2019-01-22 – 2019-01-27 (×6): 1 via ORAL
  Filled 2019-01-22 (×6): qty 1

## 2019-01-22 MED ORDER — BOOST / RESOURCE BREEZE PO LIQD CUSTOM
1.0000 | Freq: Three times a day (TID) | ORAL | Status: DC
  Administered 2019-01-22 – 2019-01-27 (×14): 1 via ORAL

## 2019-01-22 NOTE — Progress Notes (Signed)
Occupational Therapy Evaluation Patient Details Name: Kevin Potter MRN: [id number] DOB: [birth year] Today's Date: 01/22/2019    History of Present Illness Kevin Potter is an 85 y.o. male with a complex past medical history significant of a.Flutter, mild dementia,first-degree AV block, BPH, GERD, HTN, chronic tremor and unstable gait,venous insufficiency of chronic lower extremity edema not tolerant of Lasix who presents with decreased p.o. intake and abdominal discomfort. He was admitted for diarrhea from suspected gastroenteritis.   Clinical Impression   This 85 y/o male presents with the above. PTA pt reports he is mod independent with ADL and functional mobility using quad cane, reports spouse assists some with bathing ADL. Pt presenting with generalized fatigue and weakness. He currently requires minA for functional transfers using RW, up to maxA for LB and toileting ADL, minA for seated UB ADL. Pt with baseline tremors with pt reporting increased difficulty using RUE for functional tasks given deficits, overall able to incorporate grossly into mobility/ADL tasks this session. Pt reports he lives with spouse who is able to assist with ADL PRN after return home. He will benefit from continued acute OT services and recommend follow up [facility] services after discharge to maximize his safety and independence with ADL and mobility. Will follow.     Follow Up Recommendations  Home health OT;Supervision/Assistance - 24 hour    Equipment Recommendations  3 in 1 bedside commode           Precautions / Restrictions Precautions Precautions: Fall Precaution Comments: enteric Restrictions Weight Bearing Restrictions: No      Mobility Bed Mobility               General bed mobility comments: pt standing up at EOB with RN upon arrival  Transfers Overall transfer level: Needs assistance Equipment used: Rolling walker (2 wheeled) Transfers: Sit to/from [facility] Sit to  Stand: Min assist Stand pivot transfers: Min assist       General transfer comment: assist to rise and steady at RW, pt with slight poterior lean with initial stand requiring assist for forward translation; steadying assist to pivot to [facility] and then to take few steps to recliner, requires multimodal cues/assist for sequencing RW with transitions     Balance Overall balance assessment: Needs assistance Sitting-balance support: Feet supported Sitting balance-Leahy Scale: Fair     Standing balance support: Bilateral upper extremity supported Standing balance-Leahy Scale: Poor Standing balance comment: reliant on UE support at this time                           ADL either performed or assessed with clinical judgement   ADL Overall ADL's : Needs assistance/impaired Eating/Feeding: Set up;Sitting Eating/Feeding Details (indicate cue type and reason): reports he uses L hand to eat  Grooming: Set up;Min guard;Sitting   Upper Body Bathing: Minimal assistance;Sitting   Lower Body Bathing: Moderate assistance;Sit to/from stand   Upper Body Dressing : Minimal assistance;Sitting Upper Body Dressing Details (indicate cue type and reason): donning new gown Lower Body Dressing: Moderate assistance;Sit to/from stand Lower Body Dressing Details (indicate cue type and reason): minA standing balance Toilet Transfer: Minimal assistance;+2 for safety/equipment;Stand-pivot;BSC;RW Toilet Transfer Details (indicate cue type and reason): pt acitvely having BM so use of BSC vs transfer to toilet Toileting- Clothing Manipulation and Hygiene: Maximal assistance;Sit to/from stand Toileting - Clothing Manipulation Details (indicate cue type and reason): due to incontinent BM and pt requiring increased assist for peri-care, washing buttocks  and LEs; therapist providing minA for balance while RN assisting with pericare      Functional mobility during ADLs: Minimal assistance;+2 for  safety/equipment;Rolling walker General ADL Comments: pt with weakness and unsteadiness     Vision         Perception     Praxis      Pertinent Vitals/Pain Pain Assessment: Faces Faces Pain Scale: Hurts little more Pain Location: abdomen Pain Descriptors / Indicators: Discomfort Pain Intervention(s): Monitored during session;Repositioned     Hand Dominance Right   Extremity/Trunk Assessment Upper Extremity Assessment Upper Extremity Assessment: Generalized weakness;RUE deficits/detail;LUE deficits/detail RUE Deficits / Details: tremors at baseline, pt reports greater in RUE, states RUE is basically "useless" when attempting to perform functional tasks due to tremors RUE Coordination: decreased fine motor LUE Deficits / Details: baseline tremors (RUE>LUE) LUE Coordination: decreased fine motor   Lower Extremity Assessment Lower Extremity Assessment: Defer to PT evaluation       Communication Communication Communication: HOH   Cognition Arousal/Alertness: Awake/alert Behavior During Therapy: WFL for tasks assessed/performed Overall Cognitive Status: History of cognitive impairments - at baseline                                 General Comments: per chart pt with history of mild dementia, overall WFL for completing basic functional tasks this session, appears to be accurate historian when asking about PLOF   General Comments       Exercises     Shoulder Instructions      Home Living Family/patient expects to be discharged to:: Private residence Living Arrangements: Spouse/significant other Available Help at Discharge: Family;Available 24 hours/day(spouse) Type of Home: House Home Access: Stairs to enter [employer] of Steps: 3 Entrance Stairs-Rails: Right;Left Home Layout: One level;Other (Comment)(5 STE his office, otherwise on 1 level)     Bathroom Shower/Tub: Teacher, early years/pre: Standard     Home Equipment:  Environmental consultant - 2 wheels;Cane - quad;Shower seat          Prior Functioning/Environment Level of Independence: Needs assistance  Gait / Transfers Assistance Needed: pt reports he uses quad cane, reports RW doesn't fit throughout entire house ADL's / Homemaking Assistance Needed: reports spouse assists with bathing            OT Problem List: Decreased strength;Decreased range of motion;Decreased activity tolerance;Impaired balance (sitting and/or standing);Decreased safety awareness;Decreased coordination;Impaired UE functional use;Pain      OT Treatment/Interventions: Self-care/ADL training;Therapeutic exercise;Energy conservation;DME and/or AE instruction;Therapeutic activities;Patient/family education;Balance training    OT Goals(Current goals can be found in the care plan section) Acute Rehab OT Goals Patient Stated Goal: regain strength, home OT Goal Formulation: With patient Time For Goal Achievement: 02/05/19 Potential to Achieve Goals: Good  OT Frequency: Min 2X/week   Barriers to D/C:            Co-evaluation              AM-PAC OT "6 Clicks" Daily Activity     Outcome Measure Help from another person eating meals?: None Help from another person taking care of personal grooming?: A Little Help from another person toileting, which includes using toliet, bedpan, or urinal?: Total Help from another person bathing (including washing, rinsing, drying)?: A Lot Help from another person to put on and taking off regular upper body clothing?: A Little Help from another person to put on and taking off regular lower body  clothing?: A Lot 6 Click Score: 15   End of Session Equipment Utilized During Treatment: Surveyor, mining Communication: Mobility status  Activity Tolerance: Patient tolerated treatment well Patient left: in chair;with call bell/phone within reach;Other (comment)(hand off to PT for additional mobility )  OT Visit Diagnosis: Muscle weakness  (generalized) (M62.81);Unsteadiness on feet (R26.81)                Time: 8614-8307 OT Time Calculation (min): 30 min Charges:  OT General Charges $OT Visit: 1 Visit OT Evaluation $OT Eval Moderate Complexity: 1 Mod OT Treatments $Self Care/Home Management : 8-22 mins  Lou Cal, OT Supplemental Rehabilitation Services Pager [phone] Office [place] 01/22/2019, 10:40 AM

## 2019-01-22 NOTE — Care Management Obs Status (Signed)
[place] NOTIFICATION   Patient Details  Name: Kevin Potter MRN: [id number] Date of Birth: 17-Apr-1932   Medicare Observation Status Notification Given:  Yes    Marilu Favre, RN 01/22/2019, 3:20 PM

## 2019-01-22 NOTE — Progress Notes (Signed)
Patient arrived to 6N09 from the ED via stretcher and was placed on enteric precautions with education to the patient.  Patient was able to slide over to the bed from the stretcher.  Patient is A&O x4 and VSS. Patient has no complaints at this time. Oriented to room and to call bell. Will continue to monitor.  It was noted that the patient's heart rate was in the 30's on the Din[facility] but in the 80's when the patient was placed on tele.

## 2019-01-22 NOTE — TOC Initial Note (Signed)
Transition of Care [facility]) - Initial/Assessment Note    Patient Details  Name: Kevin Potter MRN: [id number] Date of Birth: April 14, 1932  Transition of [facility]) CM/SW Contact:    Marilu Favre, RN Phone Number: 01/22/2019, 3:22 PM  Clinical Narrative:                  Confirmed face sheet information with patient. Discussed PT recommendations for HHPT and HHOT and 3 in 1 . Patient states his wife is on her way to visit and NCM to return later today .    Expected Discharge Plan: [place] Barriers to Discharge: Continued Medical Work up   Patient Goals and CMS Choice Patient states their goals for this hospitalization and ongoing recovery are:: to go home CMS Medicare.gov Compare Post Acute Care list provided to:: Patient Choice offered to / list presented to : Patient  Expected Discharge Plan and Services Expected Discharge Plan: [place]       Living arrangements for the past 2 months: Single Family Home                                      Prior Living Arrangements/Services Living arrangements for the past 2 months: Single Family Home Lives with:: Spouse Patient language and need for interpreter reviewed:: Yes Do you feel safe going back to the place where you live?: Yes      Need for Family Participation in Patient Care: Yes (Comment) Care giver support system in place?: Yes (comment)   Criminal Activity/Legal Involvement Pertinent to Current Situation/Hospitalization: No - Comment as needed  Activities of Daily Living Home Assistive Devices/Equipment: None ADL Screening (condition at time of admission) Patient's cognitive ability adequate to safely complete daily activities?: Yes Is the patient deaf or have difficulty hearing?: No Does the patient have difficulty seeing, even when wearing glasses/contacts?: No Does the patient have difficulty concentrating, remembering, or making decisions?: No Patient able to  express need for assistance with ADLs?: Yes Does the patient have difficulty dressing or bathing?: No Independently performs ADLs?: Yes (appropriate for developmental age) Does the patient have difficulty walking or climbing stairs?: No Weakness of Legs: Both Weakness of Arms/Hands: None  Permission Sought/Granted   Permission granted to share information with : Yes, Verbal Permission Granted              Emotional Assessment Appearance:: Appears stated age Attitude/Demeanor/Rapport: Engaged Affect (typically observed): Accepting Orientation: : Oriented to Place, Oriented to  Time, Oriented to Situation, Oriented to Self Alcohol / Substance Use: Not Applicable Psych Involvement: No (comment)  Admission diagnosis:  Ileus ([place]) [K56.7] Generalized abdominal pain [R10.84] Patient Active Problem List   Diagnosis Date Noted  . Hyperthyroidism 01/21/2019  . Chronic systolic CHF (congestive heart failure) ([place]) 01/21/2019  . Debility 01/21/2019  . Ileus ([place]) 01/21/2019  . Diarrhea 01/21/2019  . Closed dislocation finger, proximal interphalangeal joint, traumatic 02/06/2018  . Dyspnea 12/14/2012  . Atrial flutter ([place])   . Chronic anticoagulation   . Hypertension    PCP:  Crist Infante, MD Pharmacy:   [facility] [address] [facility] [address] [place] [address] Phone: [phone] Fax: [phone]     Social Determinants of Health ([place]) Interventions    Readmission Risk Interventions No flowsheet data found.

## 2019-01-22 NOTE — Progress Notes (Addendum)
Progress Note    Kevin Potter  LDJ:[id number] DOB: 04-07-1932  DOA: 01/21/2019 PCP: Crist Infante, MD    Brief Narrative:     Medical records reviewed and are as summarized below:  Kevin Potter is an 85 y.o. male with a complex past medical history significant of a. Flutter on chronic coagulation, mild dementia, first-degree AV block, BPH, GERD, HTN, hyperthyroidism, chronic tremor and unstable gait, venous insufficiency of chronic lower extremity edema not tolerant of Lasix.  He was admitted for diarrhea from a probably gastroenteritis.    Assessment/Plan:   Active Problems:   Atrial flutter (HCC)   Chronic anticoagulation   Hypertension   Hyperthyroidism   Chronic systolic CHF (congestive heart failure) (HCC)   Debility   Diarrhea   Diarrhea (still having) - gastro panel - d/c c diff as no risk factors- no recent hospitalization and no recent abx per H&P  Atrial flutter ([place]) -    - CHA2DS2 vas score 4 : continue current anticoagulation with Eliquis,   -  resume propanolol and monitor on tele    Hyperthyroidism with large known goiter  -follows outpatient with PCP  Chronic systolic CHF (congestive heart failure) ([place])  -in the ER was slightly on the dry side -currently appears compensated-- d/c IVF and monitor closely   Debility  - PT/OT assessment  Right leg swelling  -doppler  Pleural effusion -appear to be chronic   Late entry -duplex negative -GI pathogen panel negative -delicate fluid balance with his diarrhea and pleural effusions/volume overload. Need to monitor output and replace with IVF if still having frequent stools (per chart review having 5+ stools in last 24 hours)  Family Communication/Anticipated D/C date and plan/Code Status   DVT prophylaxis: Lovenox ordered. Code Status: Full Code.  Family Communication: LM for wife-- called back and spoke with her in the afternoon Disposition Plan: PT eval   Medical Consultants:     None.     Subjective:   No pain this AM Eating breakfast  Objective:    Vitals:   01/21/19 2230 01/21/19 2315 01/22/19 0413 01/22/19 0952  BP: 107/70 126/66 (!) 129/48 120/68  Pulse: 79 87 76 90  Resp: 12 15 16    Temp:  98.4 F (36.9 C) 99.2 F (37.3 C)   TempSrc:  Oral Oral   SpO2: 93% 98% 100%   Weight:  75.8 kg    Height:  6\' 1"  (1.854 m)      Intake/Output Summary (Last 24 hours) at 01/22/2019 1116 Last data filed at 01/22/2019 0413 Gross per 24 hour  Intake 248.71 ml  Output 200 ml  Net 48.71 ml   Filed Weights   01/21/19 1438 01/21/19 2315  Weight: 77.1 kg 75.8 kg    Exam: In bed, eating breakfast Irr/flutter but rate controlled +BS, soft, NT Diminished breath sounds Speech slow (called wife to get baseline)  Data Reviewed:   I have personally reviewed following labs and imaging studies:  Labs: Labs show the following:   Basic Metabolic Panel: Recent Labs  Lab 01/21/19 1507 01/22/19 0141  NA 139 141  K 4.0 4.3  CL 101 106  CO2 29 29  GLUCOSE 107* 115*  BUN 12 15  CREATININE 0.89 0.80  CALCIUM 9.5 9.0  MG 1.9 1.9  PHOS  --  3.3   GFR Estimated Creatinine Clearance: 72.4 mL/min (by C-G formula based on SCr of 0.8 mg/dL). Liver Function Tests: Recent Labs  Lab 01/21/19 1507 01/22/19 0141  AST 14* 14*  ALT 13 11  ALKPHOS 93 82  BILITOT 2.8* 2.5*  PROT 6.7 5.9*  ALBUMIN 3.7 3.1*   Recent Labs  Lab 01/21/19 1507  LIPASE 26   No results for input(s): AMMONIA in the last 168 hours. Coagulation profile No results for input(s): INR, PROTIME in the last 168 hours.  CBC: Recent Labs  Lab 01/21/19 1507 01/22/19 0141  WBC 6.0 6.7  NEUTROABS 4.8  --   HGB 14.2 13.1  HCT 43.6 40.2  MCV 95.6 94.6  PLT 161 151   Cardiac Enzymes: No results for input(s): CKTOTAL, CKMB, CKMBINDEX, TROPONINI in the last 168 hours. BNP (last 3 results) No results for input(s): PROBNP in the last 8760 hours. CBG: No results for input(s):  GLUCAP in the last 168 hours. D-Dimer: No results for input(s): DDIMER in the last 72 hours. Hgb A1c: No results for input(s): HGBA1C in the last 72 hours. Lipid Profile: No results for input(s): CHOL, HDL, LDLCALC, TRIG, CHOLHDL, LDLDIRECT in the last 72 hours. Thyroid function studies: Recent Labs    01/22/19 0141  TSH 0.115*   Anemia work up: No results for input(s): VITAMINB12, FOLATE, FERRITIN, TIBC, IRON, RETICCTPCT in the last 72 hours. Sepsis Labs: Recent Labs  Lab 01/21/19 1507 01/22/19 0141  WBC 6.0 6.7    Microbiology Recent Results (from the past 240 hour(s))  SARS Coronavirus 2 [facility] order, Performed in [facility] hospital lab) Nasopharyngeal Nasopharyngeal Swab     Status: None   Collection Time: 01/21/19  7:39 PM   Specimen: Nasopharyngeal Swab  Result Value Ref Range Status   SARS Coronavirus 2 NEGATIVE NEGATIVE Final    Comment: (NOTE) If result is NEGATIVE SARS-CoV-2 target nucleic acids are NOT DETECTED. The SARS-CoV-2 RNA is generally detectable in upper and lower  respiratory specimens during the acute phase of infection. The lowest  concentration of SARS-CoV-2 viral copies this assay can detect is 250  copies / mL. A negative result does not preclude SARS-CoV-2 infection  and should not be used as the sole basis for treatment or other  patient management decisions.  A negative result may occur with  improper specimen collection / handling, submission of specimen other  than nasopharyngeal swab, presence of viral mutation(s) within the  areas targeted by this assay, and inadequate number of viral copies  (<250 copies / mL). A negative result must be combined with clinical  observations, patient history, and epidemiological information. If result is POSITIVE SARS-CoV-2 target nucleic acids are DETECTED. The SARS-CoV-2 RNA is generally detectable in upper and lower  respiratory specimens dur ing the acute phase of infection.  Positive  results  are indicative of active infection with SARS-CoV-2.  Clinical  correlation with patient history and other diagnostic information is  necessary to determine patient infection status.  Positive results do  not rule out bacterial infection or co-infection with other viruses. If result is PRESUMPTIVE POSTIVE SARS-CoV-2 nucleic acids MAY BE PRESENT.   A presumptive positive result was obtained on the submitted specimen  and confirmed on repeat testing.  While 2019 novel coronavirus  (SARS-CoV-2) nucleic acids may be present in the submitted sample  additional confirmatory testing may be necessary for epidemiological  and / or clinical management purposes  to differentiate between  SARS-CoV-2 and other Sarbecovirus currently known to infect humans.  If clinically indicated additional testing with an alternate test  methodology [phone]) is advised. The SARS-CoV-2 RNA is generally  detectable in upper and lower respiratory  sp ecimens during the acute  phase of infection. The expected result is Negative. Fact Sheet for Patients:  StrictlyIdeas.no Fact Sheet for Healthcare Providers: BankingDealers.co.za This test is not yet approved or cleared by the [place] FDA and has been authorized for detection and/or diagnosis of SARS-CoV-2 by FDA under an Emergency Use Authorization (EUA).  This EUA will remain in effect (meaning this test can be used) for the duration of the COVID-19 declaration under Section 564(b)(1) of the Act, 21 U.S.C. section 360bbb-3(b)(1), unless the authorization is terminated or revoked sooner. Performed at [facility], [place] [address], [place], [place] [address]     Procedures and diagnostic studies:  Ct Head Wo Contrast  Result Date: 01/21/2019 CLINICAL DATA:  Generalized weakness EXAM: CT HEAD WITHOUT CONTRAST TECHNIQUE: Contiguous axial images were obtained from the base of the skull through the vertex  without intravenous contrast. COMPARISON:  07/21/2018 MR brain FINDINGS: Brain: No evidence of acute infarction, hemorrhage, extra-axial collection, ventriculomegaly, or mass effect. Generalized cerebral atrophy. Periventricular white matter low attenuation likely secondary to microangiopathy. Vascular: Cerebrovascular atherosclerotic calcifications are noted. Skull: Negative for fracture or focal lesion. Sinuses/Orbits: Visualized portions of the orbits are unremarkable. Visualized portions of the paranasal sinuses and mastoid air cells are unremarkable. Other: None. IMPRESSION: No acute intracranial pathology. Electronically Signed   By: Kathreen Devoid   On: 01/21/2019 17:25   Ct Abdomen Pelvis W Contrast  Result Date: 01/21/2019 CLINICAL DATA:  Diffuse abdominal pain since this morning, history stroke, GERD, hypertension, diverticulosis, atrial flutter EXAM: CT ABDOMEN AND PELVIS WITH CONTRAST TECHNIQUE: Multidetector CT imaging of the abdomen and pelvis was performed using the standard protocol following bolus administration of intravenous contrast. Sagittal and coronal MPR images reconstructed from axial data set. CONTRAST:  134mL OMNIPAQUE IOHEXOL 300 MG/ML SOLN IV. No oral contrast. COMPARISON:  None FINDINGS: Lower chest: BILATERAL pleural effusions, moderate RIGHT and small LEFT. Bibasilar atelectasis. Calcified granulomata in the lower lobes. Enlargement of cardiac chambers with small pericardial effusion. Hepatobiliary: Multiple dependent calculi within gallbladder. No biliary dilatation. Liver otherwise normal appearance Pancreas: Atrophic, otherwise unremarkable Spleen: Calcified granulomata, otherwise normal appearance Adrenals/Urinary Tract: Adrenal glands normal appearance. BILATERAL peripelvic renal cysts. Kidneys otherwise normal appearance. Ureters and bladder unremarkable. Stomach/Bowel: Sigmoid and descending colonic diverticulosis without evidence of diverticulitis. Fluid in RIGHT colon.  Colon otherwise decompressed. Stomach mildly distended by fluid and air. Multiple dilated small bowel loops throughout the abdomen and pelvis with distal ileum decompressed. Short segment of distal ileum shows questionable mild wall thickening. Findings could represent ileus or low-grade small bowel obstruction. Vascular/Lymphatic: Atherosclerotic calcifications of aorta and iliac arteries. Major vascular structures appear patent. Reproductive: Prostatic enlargement, gland 6.4 x 5.7 x 7.6 cm (volume = 150 cm^3) Other: Free fluid in pelvis and between bowel loops. No definite free air. No hernia. Musculoskeletal: Diffuse osseous demineralization. Degenerative changes of the hip joints. Scattered degenerative disc disease changes lumbar spine. Osseous demineralization. IMPRESSION: Dilated small bowel loops extending to distal ileum where minimal wall thickening is seen, question ileus versus low-grade small-bowel obstruction. Small amounts of scattered free fluid without evidence of free air or perforation. Marked prostatic enlargement. BILATERAL peripelvic renal cysts. Bibasilar pleural effusions and atelectasis RIGHT greater than LEFT. Small pericardial effusion with enlargement of cardiac chambers. Electronically Signed   By: Lavonia Dana M.D.   On: 01/21/2019 17:34   Dg Chest Portable 1 View  Result Date: 01/21/2019 CLINICAL DATA:  Short of breath EXAM: PORTABLE CHEST 1 VIEW COMPARISON:  Portable exam 1524 hours compared to 02/05/2017 Correlation: CT chest 08/03/2017 FINDINGS: Enlargement of cardiac silhouette. Large RIGHT paratracheal mass extending from the level of the clavicular heads to the RIGHT hilum, measuring 12.1 x 9.9 cm in size, exerting significant RIGHT to LEFT mass effect upon the trachea with tracheal narrowing. 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 Small RIGHT pleural effusion with bibasilar atelectasis. Upper lungs clear. Calcified granuloma LEFT  upper lobe. No pneumothorax or acute osseous finding. IMPRESSION: Again identified large RIGHT superior mediastinal mass corresponding to extension of a RIGHT thyroid mass into the anterior mediastinum. Small RIGHT pleural effusion with bibasilar atelectasis. Enlargement of cardiac silhouette. Electronically Signed   By: Lavonia Dana M.D.   On: 01/21/2019 15:40    Medications:    alfuzosin  10 mg Oral Q breakfast   apixaban  5 mg Oral BID   LORazepam  1 mg Oral Daily   pantoprazole  40 mg Oral Daily   propranolol  20 mg Oral TID   tamsulosin  0.4 mg Oral QPC supper   trihexyphenidyl  2 mg Oral BID WC   Continuous Infusions:    LOS: 0 days   Geradine Girt  [facility]   How to contact the [facility] Attending or Consulting provider [place] or covering provider during after hours [place], for this patient?  1. Check the care team in [facility] and look for a) attending/consulting TRH provider listed and b) the [facility] team listed 2. Log into [email or web address] and use [place]'s universal password to access. If you do not have the password, please contact the hospital operator. 3. Locate the [facility] provider you are looking for under [facility] and page to a number that you can be directly reached. 4. If you still have difficulty reaching the provider, please page the [facility] (Director on Call) for the Hospitalists listed on amion for assistance.  01/22/2019, 11:16 AM

## 2019-01-22 NOTE — Progress Notes (Signed)
Initial Nutrition Assessment  RD working remotely.  DOCUMENTATION CODES:   Not applicable  INTERVENTION:   -MVI with minerals daily -Boost Breeze po TID, each supplement provides 250 kcal and 9 grams of protein -RD will follow for diet advancement and adjust supplement regimen as appropriate  NUTRITION DIAGNOSIS:   Inadequate oral intake related to altered GI function as evidenced by per patient/family report.  GOAL:   Patient will meet greater than or equal to 90% of their needs  MONITOR:   PO intake, Supplement acceptance, Diet advancement, Labs, Weight trends, Skin, I & O's  REASON FOR ASSESSMENT:   Consult Assessment of nutrition requirement/status  ASSESSMENT:   Kevin Potter is an 85 y.o. male with a complex past medical history significant of a. Flutter on chronic coagulation, mild dementia, first-degree AV block, BPH, GERD, HTN, hyperthyroidism, chronic tremor and unstable gait, venous insufficiency of chronic lower extremity edema not tolerant of Lasix.  He was admitted for diarrhea from a probably gastroenteritis  Pt admitted with diarrhea, awaiting gastro panel.   Reviewed I/O's: +49 ml x 24 hours  UOP: 200 ml x 24 hours  Attempted to speak with pt via phone to obtain additional history. Pt continually repeated "hello" and was unable to answer this RD's questions.  Pt currently on a clear liquid diet. Noted intake data recorded to assess at this time.   Reviewed wt hx; noted pt has experienced a 6.7% wt loss over the past 8 months. While this is not significant for time frame, this is concerning given pt's advanced age.   Albumin has a half-life of 21 days and is strongly affected by stress response and inflammatory process, therefore, do not expect to see an improvement in this lab value during acute hospitalization. When a patient presents with low albumin, it is likely skewed due to the acute inflammatory response.  Unless it is suspected that patient had  poor PO intake or malnutrition prior to admission, then RD should not be consulted solely for low albumin. Note that low albumin is no longer used to diagnose malnutrition; [place] uses the new malnutrition guidelines published by the American Society for Parenteral and Enteral Nutrition (A.S.P.E.N.) and the Academy of Nutrition and Dietetics (AND).    Therapies recommending home health PT.   Labs reviewed.   Diet Order:   Diet Order            Diet clear liquid Room service appropriate? Yes; Fluid consistency: Thin  Diet effective now              EDUCATION NEEDS:   No education needs have been identified at this time  Skin:  Skin Assessment: Reviewed RN Assessment  Last BM:  01/22/19  Height:   Ht Readings from Last 1 Encounters:  01/21/19 6\' 1"  (1.854 m)    Weight:   Wt Readings from Last 1 Encounters:  01/21/19 75.8 kg    Ideal Body Weight:  83.6 kg  BMI:  Body mass index is 22.05 kg/m.  Estimated Nutritional Needs:   Kcal:  1800-2000  Protein:  85-100 grams  Fluid:  > 1.8 L    Chidi Shirer A. Jimmye Norman, RD, LDN, [place] Registered Dietitian II Certified Diabetes Care and Education Specialist Pager: [phone] After hours Pager: [phone]

## 2019-01-22 NOTE — Progress Notes (Signed)
RLE venous duplex       has been completed. Preliminary results can be found under CV proc through chart review. Harrietta Incorvaia, BS, RDMS, RVT   

## 2019-01-22 NOTE — Evaluation (Signed)
Physical Therapy Evaluation Patient Details Name: Kevin Potter MRN: [id number] DOB: [birth year] Today's Date: 01/22/2019   History of Present Illness  Kevin Potter is an 85 y.o. male with a complex past medical history significant of a.Flutter, mild dementia,first-degree AV block, BPH, GERD, HTN, chronic tremor and unstable gait,venous insufficiency of chronic lower extremity edema not tolerant of Lasix who presents with decreased p.o. intake and abdominal discomfort. He was admitted for diarrhea from suspected gastroenteritis.  Clinical Impression  Pt admitted with above. Pt with noted R sided weakness and tremors at baseline. Pt required verbal and tactile cues to raise R LE and clear foot during ambulation. Pt reports feeling weaker than his normal and would benefit from HHPT to address weakness and decrease falls risk. Acute PT to follow.    Follow Up Recommendations Home health PT;Supervision/Assistance - 24 hour    Equipment Recommendations  None recommended by PT    Recommendations for Other Services       Precautions / Restrictions Precautions Precautions: Fall Precaution Comments: enteric Restrictions Weight Bearing Restrictions: No      Mobility  Bed Mobility               General bed mobility comments: pt in chair upon PT arrival  Transfers Overall transfer level: Needs assistance Equipment used: Rolling walker (2 wheeled) Transfers: Sit to/from [facility] Sit to Stand: Min assist Stand pivot transfers: Min assist       General transfer comment: assist to rise and steady at RW, pt with slight poterior lean with initial stand requiring assist for forward translation; steadying assist to pivot to [facility] and then to take few steps to recliner, requires multimodal cues/assist for sequencing RW with transitions   Ambulation/Gait Ambulation/Gait assistance: Min guard Gait Distance (Feet): 45 Feet Assistive device: Rolling walker (2  wheeled) Gait Pattern/deviations: Step-to pattern;Decreased step length - right;Decreased dorsiflexion - right;Narrow base of support Gait velocity: slow Gait velocity interpretation: <1.31 ft/sec, indicative of household ambulator General Gait Details: pt requiring verbal and tactile cues to clear R foot, minA for walker management  Stairs            Wheelchair Mobility    Modified Rankin (Stroke Patients Only)       Balance Overall balance assessment: Needs assistance Sitting-balance support: Feet supported Sitting balance-Leahy Scale: Fair     Standing balance support: Bilateral upper extremity supported Standing balance-Leahy Scale: Poor Standing balance comment: reliant on UE support at this time                             Pertinent Vitals/Pain Pain Assessment: Faces Faces Pain Scale: Hurts little more Pain Location: abdomen Pain Descriptors / Indicators: Discomfort Pain Intervention(s): Monitored during session;Repositioned    Home Living Family/patient expects to be discharged to:: Private residence Living Arrangements: Spouse/significant other Available Help at Discharge: Family;Available 24 hours/day(spouse) Type of Home: House Home Access: Stairs to enter Entrance Stairs-Rails: Right;Left Entrance Stairs-Number of Steps: 3 Home Layout: One level;Other (Comment)(5 STE his office, otherwise on 1 level) Home Equipment: Walker - 2 wheels;Cane - quad;Shower seat      Prior Function Level of Independence: Needs assistance   Gait / Transfers Assistance Needed: pt reports he uses quad cane, reports RW doesn't fit throughout entire house  ADL's / Homemaking Assistance Needed: reports spouse assists with bathing        Hand Dominance   Dominant Hand: Right  Extremity/Trunk Assessment   Upper Extremity Assessment Upper Extremity Assessment: Defer to OT evaluation RUE Deficits / Details: tremors at baseline, pt reports greater in RUE,  states RUE is basically "useless" when attempting to perform functional tasks due to tremors RUE Coordination: decreased fine motor LUE Deficits / Details: pt with noted tremors but these are baseline LUE Coordination: decreased fine motor    Lower Extremity Assessment Lower Extremity Assessment: RLE deficits/detail;LLE deficits/detail RLE Deficits / Details: grossly 3-/5, unable to clear foot during ambulation without max verbal cues LLE Deficits / Details: grossly 4-/5    Cervical / Trunk Assessment Cervical / Trunk Assessment: Normal  Communication   Communication: HOH  Cognition Arousal/Alertness: Awake/alert Behavior During Therapy: WFL for tasks assessed/performed Overall Cognitive Status: History of cognitive impairments - at baseline                                 General Comments: per chart pt with history of mild dementia, overall WFL for completing basic functional tasks this session, appears to be accurate historian when asking about PLOF      General Comments General comments (skin integrity, edema, etc.): assted to [facility], pt required assist for hygiene s/p BM    Exercises     Assessment/Plan    PT Assessment Patient needs continued PT services  PT Problem List Decreased strength;Decreased activity tolerance;Decreased balance;Decreased mobility;Decreased coordination;Decreased cognition;Decreased knowledge of use of DME       PT Treatment Interventions DME instruction;Gait training;Stair training;Functional mobility training;Therapeutic activities;Therapeutic exercise;Balance training    PT Goals (Current goals can be found in the Care Plan section)  Acute Rehab PT Goals Patient Stated Goal: regain strength, home PT Goal Formulation: With patient Time For Goal Achievement: 02/05/19 Potential to Achieve Goals: Good    Frequency Min 3X/week   Barriers to discharge        Co-evaluation               AM-PAC PT "6 Clicks" Mobility   Outcome Measure Help needed turning from your back to your side while in a flat bed without using bedrails?: A Little Help needed moving from lying on your back to sitting on the side of a flat bed without using bedrails?: A Little Help needed moving to and from a bed to a chair (including a wheelchair)?: A Little Help needed standing up from a chair using your arms (e.g., wheelchair or bedside chair)?: A Little Help needed to walk in hospital room?: A Little Help needed climbing 3-5 steps with a railing? : A Lot 6 Click Score: 17    End of Session Equipment Utilized During Treatment: Gait belt Activity Tolerance: Patient tolerated treatment well Patient left: in chair;with call bell/phone within reach;with chair alarm set Nurse Communication: Mobility status PT Visit Diagnosis: Unsteadiness on feet (R26.81);Muscle weakness (generalized) (M62.81)    Time: 0165-5374 PT Time Calculation (min) (ACUTE ONLY): 26 min   Charges:   PT Evaluation $PT Eval Moderate Complexity: 1 Mod PT Treatments $Gait Training: 8-22 mins        Kittie Plater, PT, DPT Acute Rehabilitation Services Pager #: [phone] Office #: [phone]   Berline Lopes 01/22/2019, 12:53 PM

## 2019-01-22 NOTE — Plan of Care (Signed)

## 2019-01-23 LAB — CBC
HCT: 38.1 % — ABNORMAL LOW (ref 39.0–52.0)
Hemoglobin: 12.7 g/dL — ABNORMAL LOW (ref 13.0–17.0)
MCH: 30.9 pg (ref 26.0–34.0)
MCHC: 33.3 g/dL (ref 30.0–36.0)
MCV: 92.7 fL (ref 80.0–100.0)
Platelets: 161 10*3/uL (ref 150–400)
RBC: 4.11 MIL/uL — ABNORMAL LOW (ref 4.22–5.81)
RDW: 13.6 % (ref 11.5–15.5)
WBC: 7.7 10*3/uL (ref 4.0–10.5)
nRBC: 0 % (ref 0.0–0.2)

## 2019-01-23 LAB — BASIC METABOLIC PANEL
Anion gap: 8 (ref 5–15)
BUN: 12 mg/dL (ref 8–23)
CO2: 25 mmol/L (ref 22–32)
Calcium: 8.4 mg/dL — ABNORMAL LOW (ref 8.9–10.3)
Chloride: 105 mmol/L (ref 98–111)
Creatinine, Ser: 0.79 mg/dL (ref 0.61–1.24)
GFR calc Af Amer: >60 mL/min (ref >60)
GFR calc non Af Amer: >60 mL/min (ref >60)
Glucose, Bld: 158 mg/dL — ABNORMAL HIGH (ref 70–99)
Potassium: 3.4 mmol/L — ABNORMAL LOW (ref 3.5–5.1)
Sodium: 138 mmol/L (ref 135–145)

## 2019-01-23 LAB — T3, FREE: T3, Free: 3 pg/mL (ref 2.0–4.4)

## 2019-01-23 MED ORDER — LOPERAMIDE HCL 2 MG PO CAPS
2.0000 mg | ORAL_CAPSULE | Freq: Four times a day (QID) | ORAL | Status: DC | PRN
  Administered 2019-01-23: 2 mg via ORAL
  Filled 2019-01-23: qty 1

## 2019-01-23 MED ORDER — POTASSIUM CHLORIDE CRYS ER 20 MEQ PO TBCR
40.0000 meq | EXTENDED_RELEASE_TABLET | Freq: Once | ORAL | Status: AC
  Administered 2019-01-23: 16:00:00 40 meq via ORAL
  Filled 2019-01-23: qty 2

## 2019-01-23 NOTE — Progress Notes (Signed)
Nutrition Follow-up  DOCUMENTATION CODES:   Not applicable  INTERVENTION:   -MVI with minerals daily -Boost Breeze po TID, each supplement provides 250 kcal and 9 grams of protein -RD will follow for diet advancement and adjust supplement regimen as appropriate  NUTRITION DIAGNOSIS:   Inadequate oral intake related to altered GI function as evidenced by per patient/family report.  Progressing   GOAL:   Patient will meet greater than or equal to 90% of their needs  Progressing   MONITOR:   PO intake, Supplement acceptance, Diet advancement, Labs, Weight trends, Skin, I & O's  REASON FOR ASSESSMENT:   Consult Assessment of nutrition requirement/status  ASSESSMENT:   Kevin Potter is an 85 y.o. male with a complex past medical history significant of a. Flutter on chronic coagulation, mild dementia, first-degree AV block, BPH, GERD, HTN, hyperthyroidism, chronic tremor and unstable gait, venous insufficiency of chronic lower extremity edema not tolerant of Lasix.  He was admitted for diarrhea from a probably gastroenteritis  Reviewed I/O's: -680 ml x 24 hours and -631 ml since admission  UOP: 800 ml x 24 hours  Case discussed with physical therapy prior to visit, who reports pt with functional decline and will likely recommend SNF at discharge.   Spoke with pt, who was sitting in recliner chair at time of visit. He was pleasant, but unable to provide history. He is unsure if he was eating well PTA. Noted pt consumed 100% of clear liquid tray, however, consumed a only a few sips of Boost Breeze.   Noted pt with generalized fat and muscle depletions, however, may be related to advanced age and Parkinson's. Pt is at high risk for malnutrition, but RD unable to identify at this time.   Labs reviewed.     Most Recent Value  Orbital Region  Mild depletion  Upper Arm Region  Mild depletion  Thoracic and Lumbar Region  Mild depletion  Buccal Region  Mild depletion  Temple  Region  Mild depletion  Clavicle Bone Region  Moderate depletion  Clavicle and Acromion Bone Region  Mild depletion  Scapular Bone Region  Mild depletion  Dorsal Hand  Mild depletion  Patellar Region  Mild depletion  Anterior Thigh Region  Mild depletion  Posterior Calf Region  Mild depletion  Edema (RD Assessment)  None  Hair  Reviewed  Eyes  Reviewed  Mouth  Reviewed  Skin  Reviewed  Nails  Reviewed      Diet Order:   Diet Order            Diet clear liquid Room service appropriate? Yes; Fluid consistency: Thin  Diet effective now              EDUCATION NEEDS:   No education needs have been identified at this time  Skin:  Skin Assessment: Reviewed RN Assessment  Last BM:  01/23/19  Height:   Ht Readings from Last 1 Encounters:  01/21/19 6\' 1"  (1.854 m)    Weight:   Wt Readings from Last 1 Encounters:  01/21/19 75.8 kg    Ideal Body Weight:  83.6 kg  BMI:  Body mass index is 22.05 kg/m.  Estimated Nutritional Needs:   Kcal:  1800-2000  Protein:  85-100 grams  Fluid:  > 1.8 L    Quaniya Damas A. Jimmye Norman, RD, LDN, [place] Registered Dietitian II Certified Diabetes Care and Education Specialist Pager: [phone] After hours Pager: [phone]

## 2019-01-23 NOTE — Plan of Care (Signed)

## 2019-01-23 NOTE — Progress Notes (Signed)
Physical Therapy Treatment Patient Details Name: Kevin Potter MRN: [id number] DOB: [birth year] Today's Date: 01/23/2019    History of Present Illness Kevin Potter is an 85 y.o. male with a complex past medical history significant of a.Flutter, mild dementia,first-degree AV block, BPH, GERD, HTN, chronic tremor and unstable gait,venous insufficiency of chronic lower extremity edema not tolerant of Lasix who presents with decreased p.o. intake and abdominal discomfort. He was admitted for diarrhea from suspected gastroenteritis.    PT Comments    Pt much more limited with functional mobility this session as compared to previous session. He required more physical assistance with transfers and unable to safely ambulate at this time as he demonstrated a consistent R lateral lean which he was unable to correct and required heavy mod A to maintain upright standing. PT updating recommendations based on change in functional mobility. However, if pt/family refusing SNF he will require 24/7 heavy physical assistance from a caregiver for mobility. Pt would continue to benefit from skilled physical therapy services at this time while admitted and after d/c to address the below listed limitations in order to improve overall safety and independence with functional mobility.   Follow Up Recommendations  SNF;Supervision/Assistance - 24 hour (if pt/family refusing, pt will require 24/7 heavy physical assistance for mobility)     Equipment Recommendations  None recommended by PT    Recommendations for Other Services       Precautions / Restrictions Precautions Precautions: Fall Restrictions Weight Bearing Restrictions: No    Mobility  Bed Mobility Overal bed mobility: Needs Assistance Bed Mobility: Supine to Sit     Supine to sit: HOB elevated;Min guard     General bed mobility comments: greatly increased time and effort needed; min guard for safety  Transfers Overall transfer level: Needs  assistance Equipment used: Rolling walker (2 wheeled) Transfers: Sit to/from [facility] Sit to Stand: Mod assist Stand pivot transfers: Mod assist       General transfer comment: pt required heavy mod A to power into standing from EOB and cueing for safe hand placement; upon standing pt with heavy posterior lean requiring continued mod A and then R lateral lean when pt initiating steps; heavy mod A required throughout  Ambulation/Gait             General Gait Details: pt able to take a few steps towards his R side to transfer to the recliner chair with mod A and RW   Stairs             Wheelchair Mobility    Modified Rankin (Stroke Patients Only)       Balance Overall balance assessment: Needs assistance Sitting-balance support: Feet supported Sitting balance-Leahy Scale: Fair     Standing balance support: Bilateral upper extremity supported Standing balance-Leahy Scale: Poor Standing balance comment: reliant on bilateral UEs on RW and mod A due to posterior and R lateral lean                            Cognition Arousal/Alertness: Awake/alert Behavior During Therapy: WFL for tasks assessed/performed Overall Cognitive Status: History of cognitive impairments - at baseline                                 General Comments: "mild dementia" per chart review; pt with very slow responses but unable to determine if that was true  cognition related or HOH      Exercises      General Comments        Pertinent Vitals/Pain Pain Assessment: Faces Faces Pain Scale: Hurts little more Pain Location: abdomen Pain Descriptors / Indicators: Discomfort Pain Intervention(s): Monitored during session;Repositioned    Home Living                      Prior Function            PT Goals (current goals can now be found in the care plan section) Acute Rehab PT Goals PT Goal Formulation: With patient Time For Goal  Achievement: 02/05/19 Potential to Achieve Goals: Good Progress towards PT goals: Progressing toward goals    Frequency    Min 3X/week      PT Plan Current plan remains appropriate;Discharge plan needs to be updated    Co-evaluation              AM-PAC PT "6 Clicks" Mobility   Outcome Measure  Help needed turning from your back to your side while in a flat bed without using bedrails?: A Little Help needed moving from lying on your back to sitting on the side of a flat bed without using bedrails?: A Little Help needed moving to and from a bed to a chair (including a wheelchair)?: A Lot Help needed standing up from a chair using your arms (e.g., wheelchair or bedside chair)?: A Lot Help needed to walk in hospital room?: A Lot Help needed climbing 3-5 steps with a railing? : Total 6 Click Score: 13    End of Session Equipment Utilized During Treatment: Gait belt Activity Tolerance: Patient tolerated treatment well Patient left: in chair;with call bell/phone within reach Nurse Communication: Mobility status PT Visit Diagnosis: Unsteadiness on feet (R26.81);Muscle weakness (generalized) (M62.81)     Time: 2353-6144 PT Time Calculation (min) (ACUTE ONLY): 16 min  Charges:  $Therapeutic Activity: 8-22 mins                     Sherie Don, PT, DPT  Acute Rehabilitation Services Pager [phone] Office [place] 01/23/2019, 2:35 PM

## 2019-01-23 NOTE — Plan of Care (Signed)
  Problem: Education: Goal: Knowledge of General Education information will improve Description: Including pain rating scale, medication(s)/side effects and non-pharmacologic comfort measures Outcome: Progressing   Problem: Health Behavior/Discharge Planning: Goal: Ability to manage health-related needs will improve Outcome: Progressing   Problem: Clinical Measurements: Goal: Ability to maintain clinical measurements within normal limits will improve Outcome: Progressing Goal: Respiratory complications will improve Outcome: Progressing Goal: Cardiovascular complication will be avoided Outcome: Progressing   Problem: Activity: Goal: Risk for activity intolerance will decrease Outcome: Progressing   Problem: Nutrition: Goal: Adequate nutrition will be maintained Outcome: Progressing   Problem: Elimination: Goal: Will not experience complications related to urinary retention Outcome: Progressing   Problem: Pain Managment: Goal: General experience of comfort will improve Outcome: Progressing   Problem: Safety: Goal: Ability to remain free from injury will improve Outcome: Progressing   Problem: Skin Integrity: Goal: Risk for impaired skin integrity will decrease Outcome: Progressing   

## 2019-01-23 NOTE — TOC Progression Note (Addendum)
Transition of Care [facility]) - Progression Note    Patient Details  Name: Kevin Potter MRN: [id number] Date of Birth: May 18, 1932  Transition of Care [facility]) CM/SW Contact  Pranay Hilbun, Edson Snowball, RN Phone Number: 01/23/2019, 10:57 AM  Clinical Narrative:     Spoke to wife [name] [phone]. Patient from home with wife. Confirmed face sheet information. Patient has bedside commode, shower chair and walker   He has an aide through [facility] 4 hours every Wednesday. Wife would like [facility] for [place], if [facility] unable to take patient then she has no preference. [facility] denied patient. I have left a message for Tiffany with [facility]. Tiffany with [facility] accepted HHPT referral.   Expected Discharge Plan: [place] Barriers to Discharge: Continued Medical Work up  Expected Discharge Plan and Services Expected Discharge Plan: [place]   Discharge Planning Services: CM Consult Post Acute Care Choice: [place] arrangements for the past 2 months: Single Family Home                 DME Arranged: N/A         HH Arranged: PT HH Agency: [facility] ([place]) Date HH Agency Contacted: 01/23/19 Time [place]: 1022 Representative spoke with at [place]: [place] ([place]) Interventions    Readmission Risk Interventions No flowsheet data found.

## 2019-01-23 NOTE — Progress Notes (Signed)
PROGRESS NOTE    Kevin Potter  RWE:[id number] DOB: Oct 05, 1931 DOA: 01/21/2019 PCP: Crist Infante, MD   Brief Narrative:  as per hpiu: 85 y.o. male with a complex past medical history significant of a. Flutter on chronic coagulation, mild dementia,first-degree AV block, BPH, GERD, HTN, hyperthyroidism, chronic tremor and unstable gait,venous insufficiency of chronic lower extremity edema not tolerant of Lasix.  He was admitted for diarrhea from a probably gastroenteritis  Subjective: Reports still diarrhea- little lose- had one- feels like his pull up bneeds to be changed now-he may have BM again, admit he has jno short term memory and having hallucination since last week. Overall feels some better. No fever. ahd colicky abdominal pain on and off reports he lives withy his wife  Assessment & Plan:  Diarrhea: Still ongoing but more soft now.  GI panel is negative.  C. difficile test was discontinued on admission due to not having any risk factors.  Continue on symptomatic management.   Atrial flutter : Rate controlled on propanolol, on Eliquis for anticoagulation.  Hypertension: Blood pressure fairly controlled on propranolol.  Hyperthyroidism with large kown goiter: Follows up with outpatient PCP.  Mild hypokalemia- replete with potassium chloride.  Chronic systolic CHF: In the ER was slightly on the dry side, currently compensated, off IV fluids.  His propanolol.  Debility/deconditioning, continue to work with PT OT.  Home health PT OT is being arranged.  Right leg swelling negative for DVT on Doppler.  Pleural effusion appears to be chronic, was from volume status.  -delicate fluid balance with his diarrhea and pleural effusions/volume overload. Need to monitor output and replace with IVF if still having frequent stools (per chart review having 5+ stools in last 24 hours  Nutrition: Nutrition Problem: Inadequate oral intake Etiology: altered GI function Signs/Symptoms: per  patient/family report Interventions: Boost Breeze, MVI  Body mass index is 22.05 kg/m.    DVT prophylaxis: eliquis Code Status: FULL CODE Family Communication: plan of care discussed with patient in detail. I called and updated his wife. Disposition Plan: Remains inpatient pending clinical improvement.  Anticipate discharge next 24 to 48 hours once diarrhea improves.  Consultants:  None  Procedure none   Microbiology: GIP negative  Antimicrobials: Anti-infectives (From admission, onward)   None    Objective: Vitals:   01/22/19 1509 01/22/19 2134 01/23/19 0538 01/23/19 1000  BP: 106/65 116/65 (!) 144/83 105/60  Pulse: 97 (!) 56 82 77  Resp: 17 15 15 15   Temp: 97.7 F (36.5 C) (!) 97.5 F (36.4 C) 98.2 F (36.8 C) 98.3 F (36.8 C)  TempSrc: Oral Oral Oral Oral  SpO2: 97% 97% 94% 93%  Weight:      Height:        Intake/Output Summary (Last 24 hours) at 01/23/2019 1031 Last data filed at 01/23/2019 0546 Gross per 24 hour  Intake 120 ml  Output 800 ml  Net -680 ml   Filed Weights   01/21/19 1438 01/21/19 2315  Weight: 77.1 kg 75.8 kg   Weight change:   Body mass index is 22.05 kg/m.  Intake/Output from previous day: 08/18 0701 - 08/19 0700 In: 120 [P.O.:120] Out: 800 [Urine:800] Intake/Output this shift: No intake/output data recorded.  Examination:  General exam: Appears calm and comfortable, tremors +, frail, thin HEENT:PERRL,Oral mucosa moist, Ear/Nose normal on gross exam Respiratory system: Bilateral equal air entry, normal vesicular breath sounds, no wheezes or crackles  Cardiovascular system: S1 & S2 heard,No JVD, murmurs. Gastrointestinal system: Abdomen is  soft, non tender, non distended, BS +  Nervous System:Alert and oriented.  Forgetful and tremulous.  No focal neurological deficits/moving extremities, sensation intact. Extremities: mild leg edema, no clubbing, distal peripheral pulses palpable. Skin: No rashes, lesions, no icterus MSK:  Normal muscle bulk,tone ,power  Medications:  Scheduled Meds: . alfuzosin  10 mg Oral Q breakfast  . apixaban  5 mg Oral BID  . feeding supplement  1 Container Oral TID BM  . LORazepam  1 mg Oral Daily  . multivitamin with minerals  1 tablet Oral Daily  . pantoprazole  40 mg Oral Daily  . propranolol  20 mg Oral TID  . tamsulosin  0.4 mg Oral QPC supper  . trihexyphenidyl  2 mg Oral BID WC   Continuous Infusions:  Data Reviewed: I have personally reviewed following labs and imaging studies  CBC: Recent Labs  Lab 01/21/19 1507 01/22/19 0141 01/23/19 0843  WBC 6.0 6.7 7.7  NEUTROABS 4.8  --   --   HGB 14.2 13.1 12.7*  HCT 43.6 40.2 38.1*  MCV 95.6 94.6 92.7  PLT 161 151 263   Basic Metabolic Panel: Recent Labs  Lab 01/21/19 1507 01/22/19 0141 01/23/19 0843  NA 139 141 138  K 4.0 4.3 3.4*  CL 101 106 105  CO2 29 29 25   GLUCOSE 107* 115* 158*  BUN 12 15 12   CREATININE 0.89 0.80 0.79  CALCIUM 9.5 9.0 8.4*  MG 1.9 1.9  --   PHOS  --  3.3  --    GFR: Estimated Creatinine Clearance: 72.4 mL/min (by C-G formula based on SCr of 0.79 mg/dL). Liver Function Tests: Recent Labs  Lab 01/21/19 1507 01/22/19 0141  AST 14* 14*  ALT 13 11  ALKPHOS 93 82  BILITOT 2.8* 2.5*  PROT 6.7 5.9*  ALBUMIN 3.7 3.1*   Recent Labs  Lab 01/21/19 1507  LIPASE 26   No results for input(s): AMMONIA in the last 168 hours. Coagulation Profile: No results for input(s): INR, PROTIME in the last 168 hours. Cardiac Enzymes: No results for input(s): CKTOTAL, CKMB, CKMBINDEX, TROPONINI in the last 168 hours. BNP (last 3 results) No results for input(s): PROBNP in the last 8760 hours. HbA1C: No results for input(s): HGBA1C in the last 72 hours. CBG: No results for input(s): GLUCAP in the last 168 hours. Lipid Profile: No results for input(s): CHOL, HDL, LDLCALC, TRIG, CHOLHDL, LDLDIRECT in the last 72 hours. Thyroid Function Tests: Recent Labs    01/22/19 0141  TSH 0.115*    Anemia Panel: No results for input(s): VITAMINB12, FOLATE, FERRITIN, TIBC, IRON, RETICCTPCT in the last 72 hours. Sepsis Labs: No results for input(s): PROCALCITON, LATICACIDVEN in the last 168 hours.  Recent Results (from the past 240 hour(s))  SARS Coronavirus 2 [facility] order, Performed in [facility] hospital lab) Nasopharyngeal Nasopharyngeal Swab     Status: None   Collection Time: 01/21/19  7:39 PM   Specimen: Nasopharyngeal Swab  Result Value Ref Range Status   SARS Coronavirus 2 NEGATIVE NEGATIVE Final    Comment: (NOTE) If result is NEGATIVE SARS-CoV-2 target nucleic acids are NOT DETECTED. The SARS-CoV-2 RNA is generally detectable in upper and lower  respiratory specimens during the acute phase of infection. The lowest  concentration of SARS-CoV-2 viral copies this assay can detect is 250  copies / mL. A negative result does not preclude SARS-CoV-2 infection  and should not be used as the sole basis for treatment or other  patient management decisions.  A negative result may occur with  improper specimen collection / handling, submission of specimen other  than nasopharyngeal swab, presence of viral mutation(s) within the  areas targeted by this assay, and inadequate number of viral copies  (<250 copies / mL). A negative result must be combined with clinical  observations, patient history, and epidemiological information. If result is POSITIVE SARS-CoV-2 target nucleic acids are DETECTED. The SARS-CoV-2 RNA is generally detectable in upper and lower  respiratory specimens dur ing the acute phase of infection.  Positive  results are indicative of active infection with SARS-CoV-2.  Clinical  correlation with patient history and other diagnostic information is  necessary to determine patient infection status.  Positive results do  not rule out bacterial infection or co-infection with other viruses. If result is PRESUMPTIVE POSTIVE SARS-CoV-2 nucleic acids MAY BE PRESENT.    A presumptive positive result was obtained on the submitted specimen  and confirmed on repeat testing.  While 2019 novel coronavirus  (SARS-CoV-2) nucleic acids may be present in the submitted sample  additional confirmatory testing may be necessary for epidemiological  and / or clinical management purposes  to differentiate between  SARS-CoV-2 and other Sarbecovirus currently known to infect humans.  If clinically indicated additional testing with an alternate test  methodology [phone]) is advised. The SARS-CoV-2 RNA is generally  detectable in upper and lower respiratory sp ecimens during the acute  phase of infection. The expected result is Negative. Fact Sheet for Patients:  StrictlyIdeas.no Fact Sheet for Healthcare Providers: BankingDealers.co.za This test is not yet approved or cleared by the [place] FDA and has been authorized for detection and/or diagnosis of SARS-CoV-2 by FDA under an Emergency Use Authorization (EUA).  This EUA will remain in effect (meaning this test can be used) for the duration of the COVID-19 declaration under Section 564(b)(1) of the Act, 21 U.S.C. section 360bbb-3(b)(1), unless the authorization is terminated or revoked sooner. Performed at [facility], [place] [address] [place], [place] [address]   Gastrointestinal Panel by PCR , Stool     Status: None   Collection Time: 01/22/19 10:08 AM   Specimen: Stool  Result Value Ref Range Status   Campylobacter species NOT DETECTED NOT DETECTED Final   Plesimonas shigelloides NOT DETECTED NOT DETECTED Final   Salmonella species NOT DETECTED NOT DETECTED Final   Yersinia enterocolitica NOT DETECTED NOT DETECTED Final   Vibrio species NOT DETECTED NOT DETECTED Final   Vibrio cholerae NOT DETECTED NOT DETECTED Final   Enteroaggregative E coli (EAEC) NOT DETECTED NOT DETECTED Final   Enteropathogenic E coli (EPEC) NOT DETECTED NOT DETECTED Final    Enterotoxigenic E coli (ETEC) NOT DETECTED NOT DETECTED Final   Shiga like toxin producing E coli (STEC) NOT DETECTED NOT DETECTED Final   Shigella/Enteroinvasive E coli (EIEC) NOT DETECTED NOT DETECTED Final   Cryptosporidium NOT DETECTED NOT DETECTED Final   Cyclospora cayetanensis NOT DETECTED NOT DETECTED Final   Entamoeba histolytica NOT DETECTED NOT DETECTED Final   Giardia lamblia NOT DETECTED NOT DETECTED Final   Adenovirus F40/41 NOT DETECTED NOT DETECTED Final   Astrovirus NOT DETECTED NOT DETECTED Final   Norovirus GI/GII NOT DETECTED NOT DETECTED Final   Rotavirus A NOT DETECTED NOT DETECTED Final   Sapovirus (I, II, IV, and V) NOT DETECTED NOT DETECTED Final    Comment: Performed at [facility], [address] [place], [place] [address]      Radiology Studies: Ct Head Wo Contrast  Result  Date: 01/21/2019 CLINICAL DATA:  Generalized weakness EXAM: CT HEAD WITHOUT CONTRAST TECHNIQUE: Contiguous axial images were obtained from the base of the skull through the vertex without intravenous contrast. COMPARISON:  07/21/2018 MR brain FINDINGS: Brain: No evidence of acute infarction, hemorrhage, extra-axial collection, ventriculomegaly, or mass effect. Generalized cerebral atrophy. Periventricular white matter low attenuation likely secondary to microangiopathy. Vascular: Cerebrovascular atherosclerotic calcifications are noted. Skull: Negative for fracture or focal lesion. Sinuses/Orbits: Visualized portions of the orbits are unremarkable. Visualized portions of the paranasal sinuses and mastoid air cells are unremarkable. Other: None. IMPRESSION: No acute intracranial pathology. Electronically Signed   By: Kathreen Devoid   On: 01/21/2019 17:25   Ct Abdomen Pelvis W Contrast  Result Date: 01/21/2019 CLINICAL DATA:  Diffuse abdominal pain since this morning, history stroke, GERD, hypertension, diverticulosis, atrial flutter EXAM: CT ABDOMEN AND PELVIS WITH CONTRAST TECHNIQUE:  Multidetector CT imaging of the abdomen and pelvis was performed using the standard protocol following bolus administration of intravenous contrast. Sagittal and coronal MPR images reconstructed from axial data set. CONTRAST:  120mL OMNIPAQUE IOHEXOL 300 MG/ML SOLN IV. No oral contrast. COMPARISON:  None FINDINGS: Lower chest: BILATERAL pleural effusions, moderate RIGHT and small LEFT. Bibasilar atelectasis. Calcified granulomata in the lower lobes. Enlargement of cardiac chambers with small pericardial effusion. Hepatobiliary: Multiple dependent calculi within gallbladder. No biliary dilatation. Liver otherwise normal appearance Pancreas: Atrophic, otherwise unremarkable Spleen: Calcified granulomata, otherwise normal appearance Adrenals/Urinary Tract: Adrenal glands normal appearance. BILATERAL peripelvic renal cysts. Kidneys otherwise normal appearance. Ureters and bladder unremarkable. Stomach/Bowel: Sigmoid and descending colonic diverticulosis without evidence of diverticulitis. Fluid in RIGHT colon. Colon otherwise decompressed. Stomach mildly distended by fluid and air. Multiple dilated small bowel loops throughout the abdomen and pelvis with distal ileum decompressed. Short segment of distal ileum shows questionable mild wall thickening. Findings could represent ileus or low-grade small bowel obstruction. Vascular/Lymphatic: Atherosclerotic calcifications of aorta and iliac arteries. Major vascular structures appear patent. Reproductive: Prostatic enlargement, gland 6.4 x 5.7 x 7.6 cm (volume = 150 cm^3) Other: Free fluid in pelvis and between bowel loops. No definite free air. No hernia. Musculoskeletal: Diffuse osseous demineralization. Degenerative changes of the hip joints. Scattered degenerative disc disease changes lumbar spine. Osseous demineralization. IMPRESSION: Dilated small bowel loops extending to distal ileum where minimal wall thickening is seen, question ileus versus low-grade small-bowel  obstruction. Small amounts of scattered free fluid without evidence of free air or perforation. Marked prostatic enlargement. BILATERAL peripelvic renal cysts. Bibasilar pleural effusions and atelectasis RIGHT greater than LEFT. Small pericardial effusion with enlargement of cardiac chambers. Electronically Signed   By: Lavonia Dana M.D.   On: 01/21/2019 17:34   Dg Chest Portable 1 View  Result Date: 01/21/2019 CLINICAL DATA:  Short of breath EXAM: PORTABLE CHEST 1 VIEW COMPARISON:  Portable exam 1524 hours compared to 02/05/2017 Correlation: CT chest 08/03/2017 FINDINGS: Enlargement of cardiac silhouette. Large RIGHT paratracheal mass extending from the level of the clavicular heads to the RIGHT hilum, measuring 12.1 x 9.9 cm in size, exerting significant RIGHT to LEFT mass effect upon the trachea with tracheal narrowing. 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 Small RIGHT pleural effusion with bibasilar atelectasis. Upper lungs clear. Calcified granuloma LEFT upper lobe. No pneumothorax or acute osseous finding. IMPRESSION: Again identified large RIGHT superior mediastinal mass corresponding to extension of a RIGHT thyroid mass into the anterior mediastinum. Small RIGHT pleural effusion with bibasilar atelectasis. Enlargement of cardiac silhouette. Electronically Signed  By: Lavonia Dana M.D.   On: 01/21/2019 15:40   Vas [place] Lower Extremity Venous (dvt)  Result Date: 01/22/2019  Lower Venous Study Indications: Edema.  Comparison Study: 09/2015 negative Performing Technologist: June Leap RDMS, RVT  Examination Guidelines: A complete evaluation includes B-mode imaging, spectral Doppler, color Doppler, and power Doppler as needed of all accessible portions of each vessel. Bilateral testing is considered an integral part of a complete examination. Limited examinations for reoccurring indications may be performed as noted.   +---------+---------------+---------+-----------+----------+--------------+ RIGHT    CompressibilityPhasicitySpontaneityPropertiesThrombus Aging +---------+---------------+---------+-----------+----------+--------------+ CFV      Full           Yes      Yes                                 +---------+---------------+---------+-----------+----------+--------------+ SFJ      Full                                                        +---------+---------------+---------+-----------+----------+--------------+ FV Prox  Full                                                        +---------+---------------+---------+-----------+----------+--------------+ FV Mid   Full                                                        +---------+---------------+---------+-----------+----------+--------------+ FV DistalFull                                                        +---------+---------------+---------+-----------+----------+--------------+ PFV      Full                                                        +---------+---------------+---------+-----------+----------+--------------+ POP      Full           Yes      Yes                                 +---------+---------------+---------+-----------+----------+--------------+ PTV      Full                                                        +---------+---------------+---------+-----------+----------+--------------+ PERO     Full                                                        +---------+---------------+---------+-----------+----------+--------------+  Summary: Right: There is no evidence of deep vein thrombosis in the lower extremity. No cystic structure found in the popliteal fossa.  *See table(s) above for measurements and observations. Electronically signed by Deitra Mayo MD on 01/22/2019 at 3:31:35 PM.    Final       LOS: 1 day   Time spent: More than 50% of that time was spent  in counseling and/or coordination of care.  Antonieta Pert, MD [facility]  01/23/2019, 10:31 AM

## 2019-01-24 LAB — T4: T4, Total: 7.8 ug/dL (ref 4.5–12.0)

## 2019-01-24 MED ORDER — POTASSIUM CHLORIDE CRYS ER 20 MEQ PO TBCR
40.0000 meq | EXTENDED_RELEASE_TABLET | Freq: Once | ORAL | Status: AC
  Administered 2019-01-24: 40 meq via ORAL
  Filled 2019-01-24: qty 2

## 2019-01-24 NOTE — Care Management Important Message (Signed)
Important Message  Patient Details  Name: Kevin Potter MRN: [id number] Date of Birth: [birth year]   Medicare Important Message Given:  Yes     Shelda Altes 01/24/2019, 4:08 PM

## 2019-01-24 NOTE — TOC Progression Note (Addendum)
Transition of Care [facility]) - Progression Note    Patient Details  Name: Kevin Potter MRN: [id number] Date of Birth: 1932/05/28  Transition of Care [facility]) CM/SW [place], [place] Phone Number: 01/24/2019, 2:00 PM  Clinical Narrative:    3:33pm- Received a call from Leda Gauze, pt daughter from [phone], she again is asking for pt to return home with hospice. CSW discussed that Harmon Pier with [facility] has called pt wife (pt daughter was not part of that conversation), we have requested PMT input regarding [place] as pt prognosis factors into appropriate services for pt at this time. Pt daughter understands, states pt will have 24/7 assist when he comes home. Encouraged pt wife to come to hospital as able to see care team members and pt level of care needs.  2:34pm- Reviewed chart with RNCM; also with [facility] liaison. They will review pt for eligibility for programs, but feel an inpatient palliative care consult would be beneficial; have messaged MD with this information.  2:00pm- CSW received a call from pt wife Ailene Ravel, she was joined by her and pt's two children Leda Gauze and Ailene Ravel and a friend Clinical cytogeneticist. They had discussed pt needs and discomfort with pt going to a facility where he could not have visitors at this time. Pt friend Vickii Chafe is familiar with pt and "worked with hospice in the past," as a family they are requesting pt come home with hospice or palliative care services. CSW discussed that those services are not 24/7 assistance and pt/pt family state understanding. We discussed that palliative services would allow for pt to continue to receive therapies at home or at a SNF. Should pt pursue hospice services pt would still not receive 24/7 care from agency, and would not have therapy services come to home.   Pt wife and pt friend inquiring about a "hospice unit" at [facility] or hospice home that pt could stay on until home ready for pt to return. We discussed that once pt is medical stable  we cannot hold him at hospital but we would work hard to ensure pt has the equipment he needs. We also discussed that hospice home is for individuals with 2 weeks prognosis or less- pt family states understanding. Pt wife given choice, amenable and requesting referral to [facility] and [place]).  Pt wife also requesting hospital bed and will think about other equipment needs the pt may have. CSW made referral to [place]. She will review chart and reach out to family. CSW also will alert RNCM to assist CSW with arranging equipment needs.    Expected Discharge Plan: [place] Barriers to Discharge: Continued Medical Work up  Expected Discharge Plan and Services Expected Discharge Plan: [place]   Discharge Planning Services: CM Consult Post Acute Care Choice: [place] Living arrangements for the past 2 months: Single Family Home                 DME Arranged: N/A         HH Arranged: PT HH Agency: [place] ([place]) Date HH Agency Contacted: 01/23/19 Time [place]: 1022 Representative spoke with at [place]: [place] ([place]) Interventions    Readmission Risk Interventions No flowsheet data found.

## 2019-01-24 NOTE — Progress Notes (Signed)
Physical Therapy Treatment Patient Details Name: Kevin Potter MRN: [id number] DOB: 06-Jan-1932 Today's Date: 01/24/2019    History of Present Illness Kevin Potter is an 85 y.o. male with a complex past medical history significant of a.Flutter, mild dementia, first-degree AV block, BPH, GERD, HTN, chronic tremor and unstable gait, venous insufficiency of chronic lower extremity edema not tolerant of Lasix who presents with decreased p.o. intake and abdominal discomfort. He was admitted for diarrhea from suspected gastroenteritis    PT Comments    Pt making progress towards achieving current functional mobility goals. However, he continues to require physical assistance for transfers and short distance ambulation with RW. Continuing to recommend pt d/c to a SNF for short term rehab prior to returning home with spouse. Pt would continue to benefit from skilled physical therapy services at this time while admitted and after d/c to address the below listed limitations in order to improve overall safety and independence with functional mobility.    Follow Up Recommendations  SNF;Supervision/Assistance - 24 hour     Equipment Recommendations  None recommended by PT    Recommendations for Other Services       Precautions / Restrictions Precautions Precautions: Fall Precaution Comments: heavy posterior lean Restrictions Weight Bearing Restrictions: No    Mobility  Bed Mobility Overal bed mobility: Needs Assistance Bed Mobility: Supine to Sit     Supine to sit: HOB elevated;Min guard Sit to supine: Min assist;HOB elevated   General bed mobility comments: min guard for safety; increased time and effort required  Transfers Overall transfer level: Needs assistance Equipment used: Rolling walker (2 wheeled) Transfers: Sit to/from Stand Sit to Stand: Mod assist Stand pivot transfers: Mod assist       General transfer comment: pt required heavy mod A to power into standing from EOB  and cueing for safe hand placement; upon standing pt with heavy posterior lean requiring continued mod A  Ambulation/Gait Ambulation/Gait assistance: Min assist;Mod assist Gait Distance (Feet): 40 Feet Assistive device: Rolling walker (2 wheeled) Gait Pattern/deviations: Step-to pattern;Decreased step length - right;Decreased step length - left;Decreased stance time - right;Decreased dorsiflexion - right;Decreased stride length Gait velocity: decreased   General Gait Details: pt with continued R lateral lean during ambulation requiring cueing to correct. Pt also requiring cueing for improved foot clearance during swing phase on R and to take longer steps with R LE   Stairs             Wheelchair Mobility    Modified Rankin (Stroke Patients Only)       Balance Overall balance assessment: Needs assistance Sitting-balance support: Feet supported Sitting balance-Leahy Scale: Fair     Standing balance support: Bilateral upper extremity supported Standing balance-Leahy Scale: Poor Standing balance comment: reliant on bilateral UEs on RW and mod A due to posterior lean                            Cognition Arousal/Alertness: Awake/alert Behavior During Therapy: Flat affect Overall Cognitive Status: History of cognitive impairments - at baseline                                 General Comments: pt asking appropriate questions during session      Exercises      General Comments        Pertinent Vitals/Pain Pain Assessment: No/denies pain  Home Living                      Prior Function            PT Goals (current goals can now be found in the care plan section) Acute Rehab PT Goals PT Goal Formulation: With patient Time For Goal Achievement: 02/05/19 Potential to Achieve Goals: Good Progress towards PT goals: Progressing toward goals    Frequency    Min 3X/week      PT Plan Current plan remains appropriate     Co-evaluation              AM-PAC PT "6 Clicks" Mobility   Outcome Measure  Help needed turning from your back to your side while in a flat bed without using bedrails?: A Little Help needed moving from lying on your back to sitting on the side of a flat bed without using bedrails?: A Little Help needed moving to and from a bed to a chair (including a wheelchair)?: A Lot Help needed standing up from a chair using your arms (e.g., wheelchair or bedside chair)?: A Lot Help needed to walk in hospital room?: A Lot Help needed climbing 3-5 steps with a railing? : Total 6 Click Score: 13    End of Session Equipment Utilized During Treatment: Gait belt Activity Tolerance: Patient tolerated treatment well Patient left: in chair;with call bell/phone within reach Nurse Communication: Mobility status PT Visit Diagnosis: Unsteadiness on feet (R26.81);Muscle weakness (generalized) (M62.81)     Time: 2334-3568 PT Time Calculation (min) (ACUTE ONLY): 23 min  Charges:  $Gait Training: 8-22 mins $Therapeutic Activity: 8-22 mins                     Sherie Don, [place], DPT  Acute Rehabilitation Services Pager [phone] Office [place] 01/24/2019, 4:31 PM

## 2019-01-24 NOTE — TOC Progression Note (Signed)
Transition of Care [facility]) - Progression Note    Patient Details  Name: MAMORU TAKESHITA MRN: [id number] Date of Birth: [birth year]  Transition of Care [facility]) CM/SW [place], [place] Phone Number: 01/24/2019, 10:34 AM  Clinical Narrative:    CSW spoke with pt wife at [phone]. We discussed new PT recommendations and the amount of physical assistance that pt is requiring. Per Ailene Ravel, she is the only 24/7 help and unless pt could independently mobilize and get himself to the bathroom she does not feel safe with him coming home until he's stronger. Pt has 2 daughters that live in town but per pt wife they have busy demanding jobs and cannot assist. Pt PCP has also called pt wife and recommended SNF. At this time she requests referrals sent to [facility], [facility], and [facility].   Will complete FL2 and send referrals through hub.    Expected Discharge Plan: [place] Barriers to Discharge: Continued Medical Work up  Expected Discharge Plan and Services Expected Discharge Plan: [place]   Discharge Planning Services: CM Consult Post Acute Care Choice: [place] Living arrangements for the past 2 months: Single Family Home                 DME Arranged: N/A HH Arranged: PT HH Agency: [place] ([place]) Date HH Agency Contacted: 01/23/19 Time [place]: 1022 Representative spoke with at [place]: [place] ([place]) Interventions    Readmission Risk Interventions No flowsheet data found.

## 2019-01-24 NOTE — Progress Notes (Signed)
Occupational Therapy Treatment Patient Details Name: Kevin Potter MRN: [id number] DOB: [birth year] Today's Date: 01/24/2019    History of present illness Kevin Potter is an 85 y.o. male with a complex past medical history significant of a.Flutter, mild dementia, first-degree AV block, BPH, GERD, HTN, chronic tremor and unstable gait, venous insufficiency of chronic lower extremity edema not tolerant of Lasix who presents with decreased p.o. intake and abdominal discomfort. He was admitted for diarrhea from suspected gastroenteritis   OT comments  Pt seen for ADL progression and functional mobility. Pt in recliner requesting to go back to bed. Pt MOD assist to sit>stand with heavy posterior lean. Max cues to shift weight anteriorly in standing. MOD A to standpivot from recliner to EOB with cues for safety and hand placement on RW. Pt able to complete LB dressing from EOB with supervision for safety when reaching out of BOS. Overall, pt making steady progress towards goals. Will continue to follow for acute OT needs.   Follow Up Recommendations  Home health OT;Supervision/Assistance - 24 hour    Equipment Recommendations  3 in 1 bedside commode    Recommendations for Other Services      Precautions / Restrictions Precautions Precautions: Fall Precaution Comments: heavy posterior lean Restrictions Weight Bearing Restrictions: No       Mobility Bed Mobility Overal bed mobility: Needs Assistance Bed Mobility: Supine to Sit     Supine to sit: HOB elevated;Min guard Sit to supine: Min assist;HOB elevated   General bed mobility comments: min guard for safety; increased time and effort required  Transfers Overall transfer level: Needs assistance Equipment used: Rolling walker (2 wheeled) Transfers: Sit to/from Stand Sit to Stand: Mod assist Stand pivot transfers: Mod assist       General transfer comment: pt required heavy mod A to power into standing from EOB and cueing for safe  hand placement; upon standing pt with heavy posterior lean requiring continued mod A    Balance Overall balance assessment: Needs assistance Sitting-balance support: Feet supported Sitting balance-Leahy Scale: Fair     Standing balance support: Bilateral upper extremity supported Standing balance-Leahy Scale: Poor Standing balance comment: reliant on bilateral UEs on RW and mod A due to posterior lean                           ADL either performed or assessed with clinical judgement   ADL Overall ADL's : Needs assistance/impaired Eating/Feeding: Set up;Sitting Eating/Feeding Details (indicate cue type and reason): assist to steady drink d/t tremors                 Lower Body Dressing: Supervision/safety;Sit to/from stand Lower Body Dressing Details (indicate cue type and reason): able to pull up socks from EOB with supervision for safety when reaching outof BOS Toilet Transfer: Moderate assistance;RW;Cueing for safety;Stand-pivot Toilet Transfer Details (indicate cue type and reason): simulated toilet transfer from recliner back to bed with. Max verbal cues shift weight anteriorly as pt with heavy posterior lean during inital stand         Functional mobility during ADLs: Moderate assistance;Cueing for safety General ADL Comments: pt with weakness and unsteadiness     Vision       Perception     Praxis      Cognition Arousal/Alertness: Awake/alert Behavior During Therapy: Flat affect Overall Cognitive Status: History of cognitive impairments - at baseline  General Comments: pt asking appropriate questions during session        Exercises     Shoulder Instructions       General Comments      Pertinent Vitals/ Pain       Pain Assessment: No/denies pain  Home Living                                          Prior Functioning/Environment              Frequency  Min 2X/week         Progress Toward Goals  OT Goals(current goals can now be found in the care plan section)  Progress towards OT goals: Progressing toward goals  Acute Rehab OT Goals Time For Goal Achievement: 02/05/19 Potential to Achieve Goals: Good  Plan      Co-evaluation                 AM-PAC OT "6 Clicks" Daily Activity     Outcome Measure   Help from another person eating meals?: None Help from another person taking care of personal grooming?: A Little Help from another person toileting, which includes using toliet, bedpan, or urinal?: Total Help from another person bathing (including washing, rinsing, drying)?: A Lot Help from another person to put on and taking off regular upper body clothing?: A Little Help from another person to put on and taking off regular lower body clothing?: A Lot 6 Click Score: 15    End of Session Equipment Utilized During Treatment: Rolling walker;Gait belt  OT Visit Diagnosis: Muscle weakness (generalized) (M62.81);Unsteadiness on feet (R26.81)   Activity Tolerance Patient tolerated treatment well   Patient Left in bed;with call bell/phone within reach   Nurse Communication Mobility status        Time: 8280-0349 OT Time Calculation (min): 10 min  Charges: OT General Charges $OT Visit: 1 Visit OT Treatments $Self Care/Home Management : 8-22 mins     Ihor Gully, COTA/L 01/24/2019, 4:32 PM Acute Rehabilitation  [phone]

## 2019-01-24 NOTE — Plan of Care (Signed)
  Problem: Education: Goal: Knowledge of General Education information will improve Description Including pain rating scale, medication(s)/side effects and non-pharmacologic comfort measures Outcome: Progressing   

## 2019-01-24 NOTE — Progress Notes (Signed)
PROGRESS NOTE    KNOWLEDGE ESCANDON  CLE:[id number] DOB: 1932/01/28 DOA: 01/21/2019 PCP: Crist Infante, MD   Brief Narrative: 85 y.o. male with a complex past medical history significant of A.Flutter on chronic coagulation, mild dementia,first-degree AV block, BPH, GERD, HTN, hyperthyroidism, chronic tremor and unstable gait,venous insufficiency of chronic lower extremity edema not tolerant of Lasix.  He was admitted for diarrhea from a probably gastroenteritis  Subjective: Patient denies any new complaints, reports no diarrhea since today.  Patient denies any abdominal pain, chest pain, shortness of breath, cough, nausea/vomiting, fever/chills.  Patient eager to be discharged  Assessment & Plan:  Diarrhea likely 2/2 acute gastroenteritis Improving Afebrile, with no leukocytosis GI panel is negative C. difficile test was discontinued on admission due to not having any risk factors.  Continue on symptomatic management.   Atrial flutter Rate controlled Continue propanolol, Eliquis for anticoagulation.  Hypertension Blood pressure fairly stable Continue propranolol  Hyperthyroidism with large kown goiter Follows up with outpatient PCP  Chronic systolic CHF Currently compensated, off IV fluids  Debility/deconditioning PT/OT recommending 24-hour care versus SNF Family yet to make decision, although refusing SNF, wanting to know more about hospice  Right leg swelling Negative for DVT on Doppler.  Pleural effusion Appears to be chronic Monitor closely    Nutrition: Nutrition Problem: Inadequate oral intake Etiology: altered GI function Signs/Symptoms: per patient/family report Interventions: Boost [place], MVI  Body mass index is 22.05 kg/m.    DVT prophylaxis: eliquis Code Status: FULL CODE Family Communication: None at bedside Disposition Plan: TBD  Consultants:  None  Procedure None   Microbiology: GIP negative  Antimicrobials: Anti-infectives (From  admission, onward)   None    Objective: Vitals:   01/23/19 1000 01/23/19 1526 01/23/19 2059 01/24/19 0500  BP: 105/60 113/65 108/64 118/67  Pulse: 77 70 74 76  Resp: 15 16 17 17   Temp: 98.3 F (36.8 C) 98.6 F (37 C) 98 F (36.7 C) 98.1 F (36.7 C)  TempSrc: Oral Oral Oral Oral  SpO2: 93% 97% 98% 99%  Weight:      Height:        Intake/Output Summary (Last 24 hours) at 01/24/2019 1427 Last data filed at 01/24/2019 1018 Gross per 24 hour  Intake 150 ml  Output -  Net 150 ml   Filed Weights   01/21/19 1438 01/21/19 2315  Weight: 77.1 kg 75.8 kg   Weight change:   Body mass index is 22.05 kg/m.  Intake/Output from previous day: 08/19 0701 - 08/20 0700 In: 300 [P.O.:300] Out: -  Intake/Output this shift: Total I/O In: 150 [P.O.:150] Out: -   Examination:  General: NAD, tremor, thin  Cardiovascular: S1, S2 present  Respiratory: CTAB  Abdomen: Soft, nontender, nondistended, bowel sounds present  Musculoskeletal: Trace bilateral pedal edema noted  Skin: Normal  Psychiatry: Normal mood   Medications:  Scheduled Meds: . alfuzosin  10 mg Oral Q breakfast  . apixaban  5 mg Oral BID  . feeding supplement  1 Container Oral TID BM  . LORazepam  1 mg Oral Daily  . multivitamin with minerals  1 tablet Oral Daily  . pantoprazole  40 mg Oral Daily  . propranolol  20 mg Oral TID  . tamsulosin  0.4 mg Oral QPC supper  . trihexyphenidyl  2 mg Oral BID WC   Continuous Infusions:  Data Reviewed: I have personally reviewed following labs and imaging studies  CBC: Recent Labs  Lab 01/21/19 1507 01/22/19 0141 01/23/19 0843  WBC  6.0 6.7 7.7  NEUTROABS 4.8  --   --   HGB 14.2 13.1 12.7*  HCT 43.6 40.2 38.1*  MCV 95.6 94.6 92.7  PLT 161 151 944   Basic Metabolic Panel: Recent Labs  Lab 01/21/19 1507 01/22/19 0141 01/23/19 0843  NA 139 141 138  K 4.0 4.3 3.4*  CL 101 106 105  CO2 29 29 25   GLUCOSE 107* 115* 158*  BUN 12 15 12   CREATININE 0.89  0.80 0.79  CALCIUM 9.5 9.0 8.4*  MG 1.9 1.9  --   PHOS  --  3.3  --    GFR: Estimated Creatinine Clearance: 72.4 mL/min (by C-G formula based on SCr of 0.79 mg/dL). Liver Function Tests: Recent Labs  Lab 01/21/19 1507 01/22/19 0141  AST 14* 14*  ALT 13 11  ALKPHOS 93 82  BILITOT 2.8* 2.5*  PROT 6.7 5.9*  ALBUMIN 3.7 3.1*   Recent Labs  Lab 01/21/19 1507  LIPASE 26   No results for input(s): AMMONIA in the last 168 hours. Coagulation Profile: No results for input(s): INR, PROTIME in the last 168 hours. Cardiac Enzymes: No results for input(s): CKTOTAL, CKMB, CKMBINDEX, TROPONINI in the last 168 hours. BNP (last 3 results) No results for input(s): PROBNP in the last 8760 hours. HbA1C: No results for input(s): HGBA1C in the last 72 hours. CBG: No results for input(s): GLUCAP in the last 168 hours. Lipid Profile: No results for input(s): CHOL, HDL, LDLCALC, TRIG, CHOLHDL, LDLDIRECT in the last 72 hours. Thyroid Function Tests: Recent Labs    01/21/19 2030 01/22/19 0141  TSH  --  0.115*  T4TOTAL 7.8  --   T3FREE 3.0  --    Anemia Panel: No results for input(s): VITAMINB12, FOLATE, FERRITIN, TIBC, IRON, RETICCTPCT in the last 72 hours. Sepsis Labs: No results for input(s): PROCALCITON, LATICACIDVEN in the last 168 hours.  Recent Results (from the past 240 hour(s))  SARS Coronavirus 2 [facility] order, Performed in [facility] hospital lab) Nasopharyngeal Nasopharyngeal Swab     Status: None   Collection Time: 01/21/19  7:39 PM   Specimen: Nasopharyngeal Swab  Result Value Ref Range Status   SARS Coronavirus 2 NEGATIVE NEGATIVE Final    Comment: (NOTE) If result is NEGATIVE SARS-CoV-2 target nucleic acids are NOT DETECTED. The SARS-CoV-2 RNA is generally detectable in upper and lower  respiratory specimens during the acute phase of infection. The lowest  concentration of SARS-CoV-2 viral copies this assay can detect is 250  copies / mL. A negative result does  not preclude SARS-CoV-2 infection  and should not be used as the sole basis for treatment or other  patient management decisions.  A negative result may occur with  improper specimen collection / handling, submission of specimen other  than nasopharyngeal swab, presence of viral mutation(s) within the  areas targeted by this assay, and inadequate number of viral copies  (<250 copies / mL). A negative result must be combined with clinical  observations, patient history, and epidemiological information. If result is POSITIVE SARS-CoV-2 target nucleic acids are DETECTED. The SARS-CoV-2 RNA is generally detectable in upper and lower  respiratory specimens dur ing the acute phase of infection.  Positive  results are indicative of active infection with SARS-CoV-2.  Clinical  correlation with patient history and other diagnostic information is  necessary to determine patient infection status.  Positive results do  not rule out bacterial infection or co-infection with other viruses. If result is PRESUMPTIVE POSTIVE SARS-CoV-2 nucleic acids  MAY BE PRESENT.   A presumptive positive result was obtained on the submitted specimen  and confirmed on repeat testing.  While 2019 novel coronavirus  (SARS-CoV-2) nucleic acids may be present in the submitted sample  additional confirmatory testing may be necessary for epidemiological  and / or clinical management purposes  to differentiate between  SARS-CoV-2 and other Sarbecovirus currently known to infect humans.  If clinically indicated additional testing with an alternate test  methodology [phone]) is advised. The SARS-CoV-2 RNA is generally  detectable in upper and lower respiratory sp ecimens during the acute  phase of infection. The expected result is Negative. Fact Sheet for Patients:  StrictlyIdeas.no Fact Sheet for Healthcare Providers: BankingDealers.co.za This test is not yet approved or  cleared by the [place] FDA and has been authorized for detection and/or diagnosis of SARS-CoV-2 by FDA under an Emergency Use Authorization (EUA).  This EUA will remain in effect (meaning this test can be used) for the duration of the COVID-19 declaration under Section 564(b)(1) of the Act, 21 U.S.C. section 360bbb-3(b)(1), unless the authorization is terminated or revoked sooner. Performed at [facility], [place] [address] [place], [place] [address]   Gastrointestinal Panel by PCR , Stool     Status: None   Collection Time: 01/22/19 10:08 AM   Specimen: Stool  Result Value Ref Range Status   Campylobacter species NOT DETECTED NOT DETECTED Final   Plesimonas shigelloides NOT DETECTED NOT DETECTED Final   Salmonella species NOT DETECTED NOT DETECTED Final   Yersinia enterocolitica NOT DETECTED NOT DETECTED Final   Vibrio species NOT DETECTED NOT DETECTED Final   Vibrio cholerae NOT DETECTED NOT DETECTED Final   Enteroaggregative E coli (EAEC) NOT DETECTED NOT DETECTED Final   Enteropathogenic E coli (EPEC) NOT DETECTED NOT DETECTED Final   Enterotoxigenic E coli (ETEC) NOT DETECTED NOT DETECTED Final   Shiga like toxin producing E coli (STEC) NOT DETECTED NOT DETECTED Final   Shigella/Enteroinvasive E coli (EIEC) NOT DETECTED NOT DETECTED Final   Cryptosporidium NOT DETECTED NOT DETECTED Final   Cyclospora cayetanensis NOT DETECTED NOT DETECTED Final   Entamoeba histolytica NOT DETECTED NOT DETECTED Final   Giardia lamblia NOT DETECTED NOT DETECTED Final   Adenovirus F40/41 NOT DETECTED NOT DETECTED Final   Astrovirus NOT DETECTED NOT DETECTED Final   Norovirus GI/GII NOT DETECTED NOT DETECTED Final   Rotavirus A NOT DETECTED NOT DETECTED Final   Sapovirus (I, II, IV, and V) NOT DETECTED NOT DETECTED Final    Comment: Performed at [facility], [address], [place], [place] [address]      Radiology Studies: No results found.    LOS: 2 days     Alma Friendly, MD [facility]  01/24/2019, 2:27 PM

## 2019-01-24 NOTE — Plan of Care (Signed)

## 2019-01-24 NOTE — NC FL2 (Signed)
[place] LEVEL OF CARE SCREENING TOOL     IDENTIFICATION  Patient Name: Kevin Potter Birthdate: [birth year] Sex: male Admission Date (Current Location): 01/21/2019  [facility] [place] [facility] [place]:  Herbalist and Address:  [facility] [place], [place] [address], [place], [place] [address]      Provider Number: [id number]  Attending Physician Name and Address:  Alma Friendly, MD  Relative Name and Phone Number:  Jonathyn Carothers; spouse, [phone]    Current Level of Care: Hospital Recommended Level of Care: [place] Prior Approval Number:    Date Approved/Denied:   PASRR Number: [id number]  Discharge Plan: SNF    Current Diagnoses: Patient Active Problem List   Diagnosis Date Noted  . Hyperthyroidism 01/21/2019  . Chronic systolic CHF (congestive heart failure) ([place]) 01/21/2019  . Debility 01/21/2019  . Ileus ([place]) 01/21/2019  . Diarrhea 01/21/2019  . Closed dislocation finger, proximal interphalangeal joint, traumatic 02/06/2018  . Dyspnea 12/14/2012  . Atrial flutter ([place])   . Chronic anticoagulation   . Hypertension     Orientation RESPIRATION BLADDER Height & Weight     Self, Time, Situation, Place  Normal Incontinent, External catheter Weight: 167 lb 1.7 oz (75.8 kg) Height:  6\' 1"  (185.4 cm)  BEHAVIORAL SYMPTOMS/MOOD NEUROLOGICAL BOWEL NUTRITION STATUS    (tremor) Incontinent Diet(see discharge summary)  AMBULATORY STATUS COMMUNICATION OF NEEDS Skin   Extensive Assist Verbally Normal(WDL)                       Personal Care Assistance Level of Assistance  Bathing, Dressing, Feeding Bathing Assistance: Maximum assistance Feeding assistance: Limited assistance Dressing Assistance: Maximum assistance     Functional Limitations Info  Sight, Hearing, Speech Sight Info: Adequate Hearing Info: Adequate Speech Info: Adequate    SPECIAL CARE FACTORS FREQUENCY  PT (By licensed PT), OT (By  licensed OT)     PT Frequency: 5x week OT Frequency: 5x week            Contractures Contractures Info: Not present    Additional Factors Info  Code Status, Allergies, Psychotropic Code Status Info: Full Code Allergies Info: No Known Allergies Psychotropic Info: LORazepam (ATIVAN) tablet 1 mg daily PO         Current Medications (01/24/2019):  This is the current hospital active medication list Current Facility-Administered Medications  Medication Dose Route Frequency Provider Last Rate Last Dose  . acetaminophen (TYLENOL) tablet 650 mg  650 mg Oral Q6H PRN Toy Baker, MD       Or  . acetaminophen (TYLENOL) suppository 650 mg  650 mg Rectal Q6H PRN Doutova, Anastassia, MD      . alfuzosin (UROXATRAL) 24 hr tablet 10 mg  10 mg Oral Q breakfast Doutova, Anastassia, MD   10 mg at 01/24/19 0823  . apixaban (ELIQUIS) tablet 5 mg  5 mg Oral BID Toy Baker, MD   5 mg at 01/24/19 1006  . feeding supplement (BOOST / RESOURCE BREEZE) liquid 1 Container  1 Container Oral TID BM Vann, Jessica U, DO   1 Container at 01/24/19 1005  . HYDROcodone-acetaminophen (NORCO/VICODIN) 5-325 MG per tablet 1-2 tablet  1-2 tablet Oral Q4H PRN Doutova, Anastassia, MD      . loperamide (IMODIUM) capsule 2 mg  2 mg Oral Q6H PRN Kc, Ramesh, MD   2 mg at 01/23/19 1537  . LORazepam (ATIVAN) tablet 1 mg  1 mg Oral Daily Doutova, Anastassia,  MD   1 mg at 01/24/19 1006  . multivitamin with minerals tablet 1 tablet  1 tablet Oral Daily Eulogio Bear U, DO   1 tablet at 01/24/19 1006  . ondansetron (ZOFRAN) tablet 4 mg  4 mg Oral Q6H PRN Doutova, Anastassia, MD       Or  . ondansetron (ZOFRAN) injection 4 mg  4 mg Intravenous Q6H PRN Doutova, Anastassia, MD      . pantoprazole (PROTONIX) EC tablet 40 mg  40 mg Oral Daily Doutova, Anastassia, MD   40 mg at 01/24/19 1006  . propranolol (INDERAL) tablet 20 mg  20 mg Oral TID Eulogio Bear U, DO   20 mg at 01/24/19 1006  . tamsulosin (FLOMAX) capsule  0.4 mg  0.4 mg Oral QPC supper Toy Baker, MD   0.4 mg at 01/23/19 1758  . trihexyphenidyl (ARTANE) tablet 2 mg  2 mg Oral BID WC Toy Baker, MD   2 mg at 01/24/19 4158     Discharge Medications: Please see discharge summary for a list of discharge medications.  Relevant Imaging Results:  Relevant Lab Results:   Additional Information SS# [place], [place]

## 2019-01-25 DIAGNOSIS — Z7189 Other specified counseling: Secondary | ICD-10-CM

## 2019-01-25 DIAGNOSIS — R1084 Generalized abdominal pain: Secondary | ICD-10-CM

## 2019-01-25 DIAGNOSIS — Z515 Encounter for palliative care: Secondary | ICD-10-CM

## 2019-01-25 LAB — CBC WITH DIFFERENTIAL/PLATELET
Abs Immature Granulocytes: 0.02 10*3/uL (ref 0.00–0.07)
Basophils Absolute: 0 10*3/uL (ref 0.0–0.1)
Basophils Relative: 1 %
Eosinophils Absolute: 0 10*3/uL (ref 0.0–0.5)
Eosinophils Relative: 1 %
HCT: 35.2 % — ABNORMAL LOW (ref 39.0–52.0)
Hemoglobin: 11.7 g/dL — ABNORMAL LOW (ref 13.0–17.0)
Immature Granulocytes: 0 %
Lymphocytes Relative: 13 %
Lymphs Abs: 0.8 10*3/uL (ref 0.7–4.0)
MCH: 31 pg (ref 26.0–34.0)
MCHC: 33.2 g/dL (ref 30.0–36.0)
MCV: 93.1 fL (ref 80.0–100.0)
Monocytes Absolute: 0.6 10*3/uL (ref 0.1–1.0)
Monocytes Relative: 10 %
Neutro Abs: 4.5 10*3/uL (ref 1.7–7.7)
Neutrophils Relative %: 75 %
Platelets: 156 10*3/uL (ref 150–400)
RBC: 3.78 MIL/uL — ABNORMAL LOW (ref 4.22–5.81)
RDW: 13.5 % (ref 11.5–15.5)
WBC: 5.9 10*3/uL (ref 4.0–10.5)
nRBC: 0 % (ref 0.0–0.2)

## 2019-01-25 LAB — BASIC METABOLIC PANEL
Anion gap: 7 (ref 5–15)
BUN: 8 mg/dL (ref 8–23)
CO2: 27 mmol/L (ref 22–32)
Calcium: 8.6 mg/dL — ABNORMAL LOW (ref 8.9–10.3)
Chloride: 105 mmol/L (ref 98–111)
Creatinine, Ser: 0.72 mg/dL (ref 0.61–1.24)
GFR calc Af Amer: >60 mL/min (ref >60)
GFR calc non Af Amer: >60 mL/min (ref >60)
Glucose, Bld: 95 mg/dL (ref 70–99)
Potassium: 4 mmol/L (ref 3.5–5.1)
Sodium: 139 mmol/L (ref 135–145)

## 2019-01-25 NOTE — Progress Notes (Signed)
Manufacturing engineer [facility])  Received request from [place] for family interest in [facility] services at home after discharge. Chart reviewed and eligibility confirmed by [facility] physician.   Spoke with spouse by phone to explain services including inability to provide volunteer services and ability to provide "virtual" visits during Covid pandemic. DME discussed and spouse requested RW to be delivered to home. They want to think about hospital bed and may order later.   Per spouse, discharge date is not known at this time. She is working on getting additional caregivers in the home. She understands that [facility] CNA visits are intermittent and based on care plan.   Please send home with patient signed and completed out of facility DNR.  Please send home with patient scripts for any medication he does not already have.   Please contact [facility] hospital liaison (listed daily on AMION under Hospice and [place]) when patient is ready for discharge.   Thank you,  Erling Conte, LCSW [phone]

## 2019-01-25 NOTE — Consult Note (Signed)
Consultation Note Date: 01/25/2019   Patient Name: Kevin Potter  DOB: [birth year]  MRN: [id number]  Age / Sex: 85 y.o., male  PCP: Crist Infante, MD Referring Physician: Alma Friendly, MD  Reason for Consultation: Establishing goals of care and Hospice Evaluation  HPI/Patient Profile: 85 y.o. male  with past medical history of a flutter on AC, dementia, BPH, GERD, HTN, hyperthyroidism, chronic tremor, unstable gait, and venous insufficiency admitted on 01/21/2019 with diarrhea, decreased PO intake, and abdominal pain.  Diarrhea has resolved. Patient with ongoing debility and inadequate oral intake. PMT consulted for [place] and hospice evaluation.   Clinical Assessment and Goals of Care: I have reviewed medical records including EPIC notes, labs and imaging, and received report from RN - patient confused, poor appetite.   I then spoke with patient's wife. Ailene Ravel,  to discuss diagnosis prognosis, GOC, EOL wishes, disposition and options.  I introduced Palliative Medicine as specialized medical care for people living with serious illness. It focuses on providing relief from the symptoms and stress of a serious illness. The goal is to improve quality of life for both the patient and the family.  As far as functional and nutritional status, she tells me patient stumbles/falls at home. She speaks of weakness and unsteadiness. He uses a cane for mobility. She tells me he was mostly able to complete bathing and toileting himself with minimal assistance. She tells me he spends most of his time sleeping. She has concerns about his swallowing - telling she has had to perform heimlech maneuver twice. She tells me about patient coughing when eating. She shares that he does not eat much - refers to him as a picky eater. She speaks of confusion - usually doesn't know the day/situation. Still recognizes her and their daughters.   Ms. Monterrosa tells me they have 2  daughters that live locally and are supportive - they both work full-time but assist with care as they are able.    We discussed his current illness and what it means in the larger context of his on-going co-morbidities.  Natural disease trajectory and expectations at EOL were discussed. We specifically discussed Mr. Licata chronic problems - multiple cardiac issues - and his overall failure to thrive, weight loss, poor appetite. Has lost about 30 pounds in the last year.   I attempted to elicit values and goals of care important to the patient. Mrs. Arredondo shares about valuing quality of life over quantity. She is interested in care that focuses on patient's comfort.     The difference between aggressive medical intervention and comfort care was considered in light of the patient's goals of care. Mrs. Lassman is hopeful to avoid aggressive medical interventions.   Advance directives, concepts specific to code status, artifical feeding and hydration, and rehospitalization were considered and discussed. Mrs. Marks is not interested in any medical interventions that would artificially prolong Mr.  Litherland life - not interested in rehospitalization unless comfort cannot be achieved outside of the hospital. We discuss his code status - he is listed as FULL - when I described what this meant she tells me that Mr. Fulco should be listed as DNR - they have discussed this before and he would want a natural death with no resuscitation attempts.   Hospice and Palliative Care services outpatient were explained and offered. Mrs. Bourret is interested in hospice care at home. We discussed hospice philosophy of care which is in line with her goals. She shares that she plans to  hire additional caregivers to assist in the home.   This conversation was shared with hospice liaison Erling Conte and social worker Michiel Cowboy.   Questions and concerns were addressed. The family was encouraged to call with questions or concerns.    Primary Decision Maker NEXT OF KIN - Joretta Bachelor   SUMMARY OF RECOMMENDATIONS   - home with hospice support - code status changed to DNR - overall goals are to promote comfort/focus on quality of life/avoid aggressive medical interventions - family working on lining up additional caregivers to assist with in home care (not interested in facility placement)  Code Status/Advance Care Planning:  DNR   Symptom Management:   Patient currently comfortable, no symptom management needs  Psycho-social/Spiritual:   Desire for further Chaplaincy support:no  Additional Recommendations: Education on Hospice  Prognosis:   Unable to determine - poor prognosis r/t multiple chronic health conditions, debility, poor PO intake, weight loss  Discharge Planning: Home with Hospice      Primary Diagnoses: Present on Admission: . Atrial flutter ([place]) . Hypertension . Hyperthyroidism . Chronic systolic CHF (congestive heart failure) ([place]) . Debility . Diarrhea   I have reviewed the medical record, interviewed the patient and family, and examined the patient. The following aspects are pertinent.  Past Medical History:  Diagnosis Date  . 1st degree AV block   . Allergy   . Arthritis    right foot ,   . Atrial flutter ([place])   . BPH (benign prostatic hyperplasia)   . Chicken pox   . Chronic anticoagulation   . Colon polyps   . Colonic adenoma   . Diverticulosis   . Erectile dysfunction   . Gallstones   . GERD (gastroesophageal reflux disease)   . Gilbert's syndrome   . Goiter   . Hemorrhoids   . Hiatal hernia   . Hypertension   . Hyperthyroidism   . Hypothyroidism   . Sleep apnea    no cpap now- lost weight   . Tremor    Social History   Socioeconomic History  . Marital status: Married    Spouse name: Not on file  . Number of children: 2  . Years of education: Not on file  . Highest education level: Not on file  Occupational History  . Occupation: Advertising copywriter  Social Needs  . Financial resource strain: Not on file  . Food insecurity    Worry: Not on file    Inability: Not on file  . Transportation needs    Medical: Not on file    Non-medical: Not on file  Tobacco Use  . Smoking status: Never Smoker  . Smokeless tobacco: Never Used  Substance and Sexual Activity  . Alcohol use: Yes    Alcohol/week: 0.0 standard drinks    Comment: red wine at night  . Drug use: No  . Sexual activity: Not Currently  Lifestyle  . Physical activity    Days per week: Not on file    Minutes per session: Not on file  . Stress: Not on file  Relationships  . Social Herbalist on phone: Not on file    Gets together: Not on file    Attends religious service: Not on file    Active member of club or organization: Not on file    Attends meetings of clubs or organizations: Not on file    Relationship status: Not on file  Other Topics Concern  . Not on file  Social  History Narrative  . Not on file   Family History  Problem Relation Age of Onset  . Heart disease Father   . Cancer Mother        unknown type  . Heart disease Mother   . Cancer Sister        unknown type  . Heart disease Brother   . Colon cancer Neg Hx   . Colon polyps Neg Hx   . Esophageal cancer Neg Hx   . Rectal cancer Neg Hx   . Stomach cancer Neg Hx    Scheduled Meds: . alfuzosin  10 mg Oral Q breakfast  . apixaban  5 mg Oral BID  . feeding supplement  1 Container Oral TID BM  . LORazepam  1 mg Oral Daily  . multivitamin with minerals  1 tablet Oral Daily  . pantoprazole  40 mg Oral Daily  . propranolol  20 mg Oral TID  . tamsulosin  0.4 mg Oral QPC supper  . trihexyphenidyl  2 mg Oral BID WC   Continuous Infusions: PRN Meds:.acetaminophen **OR** acetaminophen, HYDROcodone-acetaminophen, loperamide, ondansetron **OR** ondansetron (ZOFRAN) IV No Known Allergies  Vital Signs: BP 121/77 (BP Location: Left Arm)   Pulse 79   Temp 98.9 F (37.2 C) (Oral)    Resp 19   Ht 6\' 1"  (1.854 m)   Wt 75.8 kg   SpO2 94%   BMI 22.05 kg/m  Pain Scale: 0-10   Pain Score: 0-No pain   SpO2: SpO2: 94 % O2 Device:SpO2: 94 % O2 Flow Rate: .   IO: Intake/output summary:   Intake/Output Summary (Last 24 hours) at 01/25/2019 1553 Last data filed at 01/25/2019 1400 Gross per 24 hour  Intake 123 ml  Output 1900 ml  Net -1777 ml    LBM: Last BM Date: 01/23/19 Baseline Weight: Weight: 77.1 kg Most recent weight: Weight: 75.8 kg     Palliative Assessment/Data: PPS 50%    The above conversation was completed via telephone due to the visitor restrictions during the COVID-19 pandemic. Thorough chart review and discussion with necessary members of the care team was completed as part of assessment. All issues were discussed and addressed but no physical exam was performed.  Time Total: 70 minutes Greater than 50%  of this time was spent counseling and coordinating care related to the above assessment and plan.  Juel Burrow, DNP, AGNP-C Palliative Medicine Team [phone] Pager: [phone]

## 2019-01-25 NOTE — Progress Notes (Signed)
Occupational Therapy Treatment Patient Details Name: Kevin Potter MRN: [id number] DOB: 06/25/1931 Today's Date: 01/25/2019    History of present illness Kevin Potter is an 85 y.o. male with a complex past medical history significant of a.Flutter, mild dementia, first-degree AV block, BPH, GERD, HTN, chronic tremor and unstable gait, venous insufficiency of chronic lower extremity edema not tolerant of Lasix who presents with decreased p.o. intake and abdominal discomfort. He was admitted for diarrhea from suspected gastroenteritis   OT comments  Pt making steady progress towards OT goals. Overall, pt modified independent to get to EOB. Pt supervision for LB dressing with pt able to maintain sitting balance when reaching out of BOS to pull socks up. Pt MOD+2 for safety for functional mobility to<>from bathroom. Verbal cues to widen BOS when ambulating and MOD verbal cues for safety awareness during functional mobility. Pt transferred to toilet with MIN +2 needing assist to initially maintain standing balance from elevated BSC over toilet and cues to reach back when descending onto surface.Pt completed standing grooming at sink with MIN A for standing balance. Pt requiring MOD A when reaching out of BOS to wash LB. Pt able to complete UB dressing in sitting with supervision for safety. Will continue to follow for acute OT needs.   Follow Up Recommendations  Home health OT;Supervision/Assistance - 24 hour    Equipment Recommendations  3 in 1 bedside commode    Recommendations for Other Services      Precautions / Restrictions Precautions Precautions: Fall Precaution Comments: heavy posterior lean Restrictions Weight Bearing Restrictions: No       Mobility Bed Mobility Overal bed mobility: Modified Independent Bed Mobility: Supine to Sit;Sit to Supine     Supine to sit: Modified independent (Device/Increase time) Sit to supine: Min assist;HOB elevated   General bed mobility comments:  Cues to place feet on floor before pt try to stand, increaed time  Transfers Overall transfer level: Needs assistance Equipment used: Rolling walker (2 wheeled) Transfers: Sit to/from Stand Sit to Stand: Mod assist;+2 safety/equipment Stand pivot transfers: Mod assist;+2 safety/equipment       General transfer comment: Pt requiring most assist initally to maintain standing balance with RW, verbal cues for spacing feet out during transfer as pt trying to keep feet close together decreasing pts bos.    Balance Overall balance assessment: Needs assistance Sitting-balance support: Feet supported Sitting balance-Leahy Scale: Fair Sitting balance - Comments: able to reach out of BOS to pull of socks at EOB   Standing balance support: Single extremity supported;During functional activity(MIN- MOD A to maintain standing balance during functional activity at the sink) Standing balance-Leahy Scale: Poor                             ADL either performed or assessed with clinical judgement   ADL Overall ADL's : Needs assistance/impaired     Grooming: Wash/dry hands;Minimal assistance;Standing;Moderate assistance;Cueing for safety Grooming Details (indicate cue type and reason): MIN A to maintain dynamic standing balance- verbal cues to stay within the RW at sink Upper Body Bathing: Supervision/ safety;Set up;Sitting;Cueing for sequencing Upper Body Bathing Details (indicate cue type and reason): cues for sequencing of steps in novel enviroment- locating soap Lower Body Bathing: Moderate assistance;Sit to/from stand;+2 for safety/equipment Lower Body Bathing Details (indicate cue type and reason): MOD A for pericare in standing to maintain standing balance Upper Body Dressing : Minimal assistance;Sitting Upper Body Dressing Details (indicate cue  type and reason): donning new gown Lower Body Dressing: Supervision/safety;Sit to/from stand Lower Body Dressing Details (indicate cue  type and reason): able to pull up socks from EOB with supervision for safety when reaching outof BOS Toilet Transfer: Moderate assistance;RW;Cueing for safety;+2 for safety/equipment;Ambulation;Cueing for sequencing;BSC Toilet Transfer Details (indicate cue type and reason): ambulated to toilet with MOD +2 for safety. MAX verbal cues for sequencing of steps to redirect pt to ambulate to toilet         Functional mobility during ADLs: Moderate assistance;Cueing for safety General ADL Comments: more steady on feet today, needing most assit for balance in standing and cues to shift weight anteriorly during standing ADLS     Vision       Perception     Praxis      Cognition Arousal/Alertness: Awake/alert Behavior During Therapy: (Pleasant and able to recall therapist name from yesterday) Overall Cognitive Status: History of cognitive impairments - at baseline                                 General Comments: pt asking appropriate questions during session        Exercises     Shoulder Instructions       General Comments      Pertinent Vitals/ Pain       Pain Assessment: No/denies pain  Home Living                                          Prior Functioning/Environment              Frequency  Min 2X/week        Progress Toward Goals  OT Goals(current goals can now be found in the care plan section)  Progress towards OT goals: Progressing toward goals  Acute Rehab OT Goals Time For Goal Achievement: 02/05/19 Potential to Achieve Goals: Good  Plan Discharge plan remains appropriate    Co-evaluation                 AM-PAC OT "6 Clicks" Daily Activity     Outcome Measure   Help from another person eating meals?: None Help from another person taking care of personal grooming?: A Little Help from another person toileting, which includes using toliet, bedpan, or urinal?: A Lot Help from another person bathing  (including washing, rinsing, drying)?: A Little Help from another person to put on and taking off regular upper body clothing?: A Little Help from another person to put on and taking off regular lower body clothing?: A Lot 6 Click Score: 17    End of Session Equipment Utilized During Treatment: Rolling walker;Gait belt  OT Visit Diagnosis: Muscle weakness (generalized) (M62.81);Unsteadiness on feet (R26.81)   Activity Tolerance Patient tolerated treatment well   Patient Left in bed;with call bell/phone within reach;with nursing/sitter in room   Nurse Communication          Time: [id number] OT Time Calculation (min): 23 min  Charges: OT General Charges $OT Visit: 1 Visit OT Treatments $Self Care/Home Management : 23-37 mins     Ihor Gully, COTA/L 01/25/2019, 12:22 PM  [phone]

## 2019-01-25 NOTE — Progress Notes (Signed)
PROGRESS NOTE    Kevin Potter  [id number] DOB: 06-02-1932 DOA: 01/21/2019 PCP: Crist Infante, MD   Brief Narrative: 85 y.o. male with a complex past medical history significant of A.Flutter on chronic coagulation, mild dementia,first-degree AV block, BPH, GERD, HTN, hyperthyroidism, chronic tremor and unstable gait,venous insufficiency of chronic lower extremity edema not tolerant of Lasix.  He was admitted for diarrhea from a probably gastroenteritis  Subjective: Patient seen and examined at bedside, denies any new complaints today.  Assessment & Plan:  Diarrhea likely 2/2 acute gastroenteritis Resolved Afebrile, with no leukocytosis GI panel is negative C. difficile test was discontinued on admission due to not having any risk factors.  Continue on symptomatic management.   Atrial flutter Rate controlled Continue propanolol, Eliquis for anticoagulation.  Hypertension Blood pressure fairly stable Continue propranolol  Hyperthyroidism with large kown goiter Follows up with outpatient PCP  Chronic systolic CHF Currently compensated, off IV fluids  Debility/deconditioning PT/OT recommending 24-hour care versus SNF Family yet to make decision, although refusing SNF, wanting to know more about hospice  Right leg swelling Negative for DVT on Doppler.  Pleural effusion Appears to be chronic Monitor closely    Nutrition: Nutrition Problem: Inadequate oral intake Etiology: altered GI function Signs/Symptoms: per patient/family report Interventions: Boost Breeze, MVI  Body mass index is 22.05 kg/m.    DVT prophylaxis: eliquis Code Status: FULL CODE Family Communication: None at bedside Disposition Plan: TBD  Consultants:  None  Procedure None   Microbiology: GIP negative  Antimicrobials: Anti-infectives (From admission, onward)   None    Objective: Vitals:   01/24/19 1533 01/24/19 2041 01/24/19 2046 01/25/19 0532  BP: 105/69 125/65 125/65  121/77  Pulse: 61 77 77 79  Resp: 16 19  19   Temp: 98.1 F (36.7 C) 98.8 F (37.1 C)  98.9 F (37.2 C)  TempSrc: Oral Oral  Oral  SpO2: 95% 94%  94%  Weight:      Height:        Intake/Output Summary (Last 24 hours) at 01/25/2019 1305 Last data filed at 01/25/2019 0900 Gross per 24 hour  Intake 120 ml  Output 1900 ml  Net -1780 ml   Filed Weights   01/21/19 1438 01/21/19 2315  Weight: 77.1 kg 75.8 kg   Weight change:   Body mass index is 22.05 kg/m.  Intake/Output from previous day: 08/20 0701 - 08/21 0700 In: 390 [P.O.:390] Out: 1900 [Urine:1900] Intake/Output this shift: Total I/O In: 120 [P.O.:120] Out: -   Examination:  General: NAD, tremor  Cardiovascular: S1, S2 present  Respiratory: CTAB  Abdomen: Soft, nontender, nondistended, bowel sounds present  Musculoskeletal: No bilateral pedal edema noted  Skin: Normal  Psychiatry: Normal mood    Medications:  Scheduled Meds:  alfuzosin  10 mg Oral Q breakfast   apixaban  5 mg Oral BID   feeding supplement  1 Container Oral TID BM   LORazepam  1 mg Oral Daily   multivitamin with minerals  1 tablet Oral Daily   pantoprazole  40 mg Oral Daily   propranolol  20 mg Oral TID   tamsulosin  0.4 mg Oral QPC supper   trihexyphenidyl  2 mg Oral BID WC   Continuous Infusions:  Data Reviewed: I have personally reviewed following labs and imaging studies  CBC: Recent Labs  Lab 01/21/19 1507 01/22/19 0141 01/23/19 0843 01/25/19 0149  WBC 6.0 6.7 7.7 5.9  NEUTROABS 4.8  --   --  4.5  HGB 14.2 13.1 12.7*  11.7*  HCT 43.6 40.2 38.1* 35.2*  MCV 95.6 94.6 92.7 93.1  PLT 161 151 161 [id number]   Basic Metabolic Panel: Recent Labs  Lab 01/21/19 1507 01/22/19 0141 01/23/19 0843 01/25/19 0149  NA 139 141 138 139  K 4.0 4.3 3.4* 4.0  CL 101 106 105 105  CO2 29 29 25 27   GLUCOSE 107* 115* 158* 95  BUN 12 15 12 8   CREATININE 0.89 0.80 0.79 0.72  CALCIUM 9.5 9.0 8.4* 8.6*  MG 1.9 1.9  --   --     PHOS  --  3.3  --   --    GFR: Estimated Creatinine Clearance: 72.4 mL/min (by C-G formula based on SCr of 0.72 mg/dL). Liver Function Tests: Recent Labs  Lab 01/21/19 1507 01/22/19 0141  AST 14* 14*  ALT 13 11  ALKPHOS 93 82  BILITOT 2.8* 2.5*  PROT 6.7 5.9*  ALBUMIN 3.7 3.1*   Recent Labs  Lab 01/21/19 1507  LIPASE 26   No results for input(s): AMMONIA in the last 168 hours. Coagulation Profile: No results for input(s): INR, PROTIME in the last 168 hours. Cardiac Enzymes: No results for input(s): CKTOTAL, CKMB, CKMBINDEX, TROPONINI in the last 168 hours. BNP (last 3 results) No results for input(s): PROBNP in the last 8760 hours. HbA1C: No results for input(s): HGBA1C in the last 72 hours. CBG: No results for input(s): GLUCAP in the last 168 hours. Lipid Profile: No results for input(s): CHOL, HDL, LDLCALC, TRIG, CHOLHDL, LDLDIRECT in the last 72 hours. Thyroid Function Tests: No results for input(s): TSH, T4TOTAL, FREET4, T3FREE, THYROIDAB in the last 72 hours. Anemia Panel: No results for input(s): VITAMINB12, FOLATE, FERRITIN, TIBC, IRON, RETICCTPCT in the last 72 hours. Sepsis Labs: No results for input(s): PROCALCITON, LATICACIDVEN in the last 168 hours.  Recent Results (from the past 240 hour(s))  SARS Coronavirus 2 [facility] order, Performed in [facility] hospital lab) Nasopharyngeal Nasopharyngeal Swab     Status: None   Collection Time: 01/21/19  7:39 PM   Specimen: Nasopharyngeal Swab  Result Value Ref Range Status   SARS Coronavirus 2 NEGATIVE NEGATIVE Final    Comment: (NOTE) If result is NEGATIVE SARS-CoV-2 target nucleic acids are NOT DETECTED. The SARS-CoV-2 RNA is generally detectable in upper and lower  respiratory specimens during the acute phase of infection. The lowest  concentration of SARS-CoV-2 viral copies this assay can detect is 250  copies / mL. A negative result does not preclude SARS-CoV-2 infection  and should not be used as the  sole basis for treatment or other  patient management decisions.  A negative result may occur with  improper specimen collection / handling, submission of specimen other  than nasopharyngeal swab, presence of viral mutation(s) within the  areas targeted by this assay, and inadequate number of viral copies  (<250 copies / mL). A negative result must be combined with clinical  observations, patient history, and epidemiological information. If result is POSITIVE SARS-CoV-2 target nucleic acids are DETECTED. The SARS-CoV-2 RNA is generally detectable in upper and lower  respiratory specimens dur ing the acute phase of infection.  Positive  results are indicative of active infection with SARS-CoV-2.  Clinical  correlation with patient history and other diagnostic information is  necessary to determine patient infection status.  Positive results do  not rule out bacterial infection or co-infection with other viruses. If result is PRESUMPTIVE POSTIVE SARS-CoV-2 nucleic acids MAY BE PRESENT.   A presumptive positive result was obtained  on the submitted specimen  and confirmed on repeat testing.  While 2019 novel coronavirus  (SARS-CoV-2) nucleic acids may be present in the submitted sample  additional confirmatory testing may be necessary for epidemiological  and / or clinical management purposes  to differentiate between  SARS-CoV-2 and other Sarbecovirus currently known to infect humans.  If clinically indicated additional testing with an alternate test  methodology [phone]) is advised. The SARS-CoV-2 RNA is generally  detectable in upper and lower respiratory sp ecimens during the acute  phase of infection. The expected result is Negative. Fact Sheet for Patients:  StrictlyIdeas.no Fact Sheet for Healthcare Providers: BankingDealers.co.za This test is not yet approved or cleared by the [place] FDA and has been authorized for detection  and/or diagnosis of SARS-CoV-2 by FDA under an Emergency Use Authorization (EUA).  This EUA will remain in effect (meaning this test can be used) for the duration of the COVID-19 declaration under Section 564(b)(1) of the Act, 21 U.S.C. section 360bbb-3(b)(1), unless the authorization is terminated or revoked sooner. Performed at [facility], [place] [address] [place], [place] [address]   Gastrointestinal Panel by PCR , Stool     Status: None   Collection Time: 01/22/19 10:08 AM   Specimen: Stool  Result Value Ref Range Status   Campylobacter species NOT DETECTED NOT DETECTED Final   Plesimonas shigelloides NOT DETECTED NOT DETECTED Final   Salmonella species NOT DETECTED NOT DETECTED Final   Yersinia enterocolitica NOT DETECTED NOT DETECTED Final   Vibrio species NOT DETECTED NOT DETECTED Final   Vibrio cholerae NOT DETECTED NOT DETECTED Final   Enteroaggregative E coli (EAEC) NOT DETECTED NOT DETECTED Final   Enteropathogenic E coli (EPEC) NOT DETECTED NOT DETECTED Final   Enterotoxigenic E coli (ETEC) NOT DETECTED NOT DETECTED Final   Shiga like toxin producing E coli (STEC) NOT DETECTED NOT DETECTED Final   Shigella/Enteroinvasive E coli (EIEC) NOT DETECTED NOT DETECTED Final   Cryptosporidium NOT DETECTED NOT DETECTED Final   Cyclospora cayetanensis NOT DETECTED NOT DETECTED Final   Entamoeba histolytica NOT DETECTED NOT DETECTED Final   Giardia lamblia NOT DETECTED NOT DETECTED Final   Adenovirus F40/41 NOT DETECTED NOT DETECTED Final   Astrovirus NOT DETECTED NOT DETECTED Final   Norovirus GI/GII NOT DETECTED NOT DETECTED Final   Rotavirus A NOT DETECTED NOT DETECTED Final   Sapovirus (I, II, IV, and V) NOT DETECTED NOT DETECTED Final    Comment: Performed at [facility], [address] [place],  [address]      Radiology Studies: No results found.    LOS: 3 days    Alma Friendly, MD [facility]  01/25/2019, 1:05 PM

## 2019-01-25 NOTE — TOC Progression Note (Signed)
Transition of Care [facility]) - Progression Note    Patient Details  Name: Kevin Potter MRN: [id number] Date of Birth: [birth year]  Transition of Care [facility]) CM/SW [place], [place] Phone Number: 01/25/2019, 3:35 PM  Clinical Narrative:    CSW received a call from [place] with PMT, pt family has requested that pt go home with [facility]. PMT has reached out to [place] because pt wife requesting recommendations for private home caregivers. CSW has left HIPAA compliant message with pt wife at [phone].   CSW also reached out to pt daughter Kevin Potter at [phone].  Kevin Potter and CSW discussed conferring with friends and neighbors to discuss private aide services and with hospice agency to ensure appropriate equipment in the home.   Kevin Potter requesting the doctor please reach out to family to discuss readiness, she is aware that pt likely will dc over the weekend.     Expected Discharge Plan: Home w Hospice Care Barriers to Discharge: Continued Medical Work up  Expected Discharge Plan and Services Expected Discharge Plan: [place] Discharge Planning Services: CM Consult Post Acute Care Choice: [place] arrangements for the past 2 months: Single Family Home           DME Arranged: N/A  HH Arranged: PT HH Agency: [facility] ([place]) Date HH Agency Contacted: 01/23/19 Time [place]: 1022 Representative spoke with at [place]: [place] ([place]) Interventions    Readmission Risk Interventions No flowsheet data found.

## 2019-01-25 NOTE — Plan of Care (Signed)
  Problem: Health Behavior/Discharge Planning: Goal: Ability to manage health-related needs will improve Outcome: Progressing   

## 2019-01-25 NOTE — Progress Notes (Signed)
Pt wife Kevin Potter tel no [phone] called and giving [place] permission to give the info to [facility] c[facility]p [facility] tel [phone] .

## 2019-01-26 ENCOUNTER — Inpatient Hospital Stay (HOSPITAL_COMMUNITY): Payer: Medicare Other

## 2019-01-26 NOTE — Progress Notes (Signed)
   01/26/19 0146  What Happened  Was fall witnessed? No  Was patient injured? No  Patient found on floor  Found by Staff-comment  Stated prior activity to/from bed, chair, or stretcher  Follow Up  MD notified Lovey Newcomer NP  Time MD notified [phone]  Additional tests No  Adult Fall Risk Assessment  Risk Factor Category (scoring not indicated) High fall risk per protocol (document High fall risk)  Age 85  Fall History: Fall within 6 months prior to admission 0  Elimination; Bowel and/or Urine Incontinence 2  Elimination; Bowel and/or Urine Urgency/Frequency 2  Medications: includes PCA/Opiates, Anti-convulsants, Anti-hypertensives, Diuretics, Hypnotics, Laxatives, Sedatives, and Psychotropics 3  Patient Care Equipment 0  Mobility-Assistance 2  Mobility-Gait 2  Mobility-Sensory Deficit 0  Altered awareness of immediate physical environment 1  Impulsiveness 2  Lack of understanding of one's physical/cognitive limitations 0  Total Score 17  Patient Fall Risk Level High fall risk  Adult Fall Risk Interventions  Required Bundle Interventions *See Row Information* High fall risk - low, moderate, and high requirements implemented  Additional Interventions PT/OT need assessed if change in mobility from baseline;Use of appropriate toileting equipment (bedpan, BSC, etc.)  Screening for Fall Injury Risk (To be completed on HIGH fall risk patients) - Assessing Need for Low Bed  Risk For Fall Injury- Low Bed Criteria 85 years or older  Will Implement Low Bed and Floor Mats Yes  Vitals  Temp 98.2 F (36.8 C)  Temp Source Oral  BP 103/82  BP Location Left Arm  BP Method Automatic  Patient Position (if appropriate) Lying  Pulse Rate 96  Resp 19  Oxygen Therapy  SpO2 96 %  O2 Device Room Air  Pain Assessment  Pain Scale 0-10  Pain Score 0

## 2019-01-26 NOTE — Progress Notes (Signed)
PROGRESS NOTE    Kevin Potter  [id number] DOB: 01/19/1932 DOA: 01/21/2019 PCP: Crist Infante, MD   Brief Narrative: 85 y.o. male with a complex past medical history significant of A.Flutter on chronic coagulation, mild dementia,first-degree AV block, BPH, GERD, HTN, hyperthyroidism, chronic tremor and unstable gait,venous insufficiency of chronic lower extremity edema not tolerant of Lasix.  He was admitted for diarrhea from a probably gastroenteritis  Subjective: Patient denies any new complaints  Assessment & Plan:  Diarrhea likely 2/2 acute gastroenteritis Resolved Afebrile, with no leukocytosis GI panel is negative C. difficile test was discontinued on admission due to not having any risk factors.  Continue on symptomatic management.   Atrial flutter Rate controlled Continue propanolol, Eliquis for anticoagulation.  Hypertension Blood pressure fairly stable Continue propranolol  Hyperthyroidism with large kown goiter Follows up with outpatient PCP  Chronic systolic CHF Currently compensated, off IV fluids  Debility/deconditioning PT/OT recommending 24-hour care versus SNF Family yet to make decision, although refusing SNF, wanting to know more about hospice  Right leg swelling Negative for DVT on Doppler.  Pleural effusion Appears to be chronic Monitor closely    Nutrition: Nutrition Problem: Inadequate oral intake Etiology: altered GI function Signs/Symptoms: per patient/family report Interventions: Boost Breeze, MVI  Body mass index is 22.05 kg/m.    DVT prophylaxis: eliquis Code Status: DNR Family Communication: Discussed with wife on 01/26/2019 Disposition Plan: Plan to DC on 01/27/2019 pending delivery of hospital equipments to home  Consultants:  None  Procedure None   Microbiology: GIP negative  Antimicrobials: Anti-infectives (From admission, onward)   None    Objective: Vitals:   01/26/19 0121 01/26/19 0146 01/26/19 0653  01/26/19 1417  BP: 103/82 103/82 126/63 117/66  Pulse: 96 96 80 (!) 57  Resp: 19 19 20 18   Temp: 98.2 F (36.8 C) 98.2 F (36.8 C) 98.7 F (37.1 C) 98.3 F (36.8 C)  TempSrc: Oral Oral Oral Oral  SpO2: 96% 96% 94% 96%  Weight:      Height:        Intake/Output Summary (Last 24 hours) at 01/26/2019 1438 Last data filed at 01/26/2019 0900 Gross per 24 hour  Intake 240 ml  Output -  Net 240 ml   Filed Weights   01/21/19 1438 01/21/19 2315  Weight: 77.1 kg 75.8 kg   Weight change:   Body mass index is 22.05 kg/m.  Intake/Output from previous day: 08/21 0701 - 08/22 0700 In: 123 [P.O.:123] Out: -  Intake/Output this shift: Total I/O In: 240 [P.O.:240] Out: -   Examination:   General: NAD, tremor  Cardiovascular: S1, S2 present  Respiratory: CTAB  Abdomen: Soft, nontender, nondistended, bowel sounds present  Musculoskeletal: No bilateral pedal edema noted  Skin: Normal  Psychiatry: Normal mood    Medications:  Scheduled Meds: . alfuzosin  10 mg Oral Q breakfast  . apixaban  5 mg Oral BID  . feeding supplement  1 Container Oral TID BM  . LORazepam  1 mg Oral Daily  . multivitamin with minerals  1 tablet Oral Daily  . pantoprazole  40 mg Oral Daily  . propranolol  20 mg Oral TID  . tamsulosin  0.4 mg Oral QPC supper  . trihexyphenidyl  2 mg Oral BID WC   Continuous Infusions:  Data Reviewed: I have personally reviewed following labs and imaging studies  CBC: Recent Labs  Lab 01/21/19 1507 01/22/19 0141 01/23/19 0843 01/25/19 0149  WBC 6.0 6.7 7.7 5.9  NEUTROABS 4.8  --   --  4.5  HGB 14.2 13.1 12.7* 11.7*  HCT 43.6 40.2 38.1* 35.2*  MCV 95.6 94.6 92.7 93.1  PLT 161 151 161 [id number]   Basic Metabolic Panel: Recent Labs  Lab 01/21/19 1507 01/22/19 0141 01/23/19 0843 01/25/19 0149  NA 139 141 138 139  K 4.0 4.3 3.4* 4.0  CL 101 106 105 105  CO2 29 29 25 27   GLUCOSE 107* 115* 158* 95  BUN 12 15 12 8   CREATININE 0.89 0.80 0.79 0.72   CALCIUM 9.5 9.0 8.4* 8.6*  MG 1.9 1.9  --   --   PHOS  --  3.3  --   --    GFR: Estimated Creatinine Clearance: 72.4 mL/min (by C-G formula based on SCr of 0.72 mg/dL). Liver Function Tests: Recent Labs  Lab 01/21/19 1507 01/22/19 0141  AST 14* 14*  ALT 13 11  ALKPHOS 93 82  BILITOT 2.8* 2.5*  PROT 6.7 5.9*  ALBUMIN 3.7 3.1*   Recent Labs  Lab 01/21/19 1507  LIPASE 26   No results for input(s): AMMONIA in the last 168 hours. Coagulation Profile: No results for input(s): INR, PROTIME in the last 168 hours. Cardiac Enzymes: No results for input(s): CKTOTAL, CKMB, CKMBINDEX, TROPONINI in the last 168 hours. BNP (last 3 results) No results for input(s): PROBNP in the last 8760 hours. HbA1C: No results for input(s): HGBA1C in the last 72 hours. CBG: No results for input(s): GLUCAP in the last 168 hours. Lipid Profile: No results for input(s): CHOL, HDL, LDLCALC, TRIG, CHOLHDL, LDLDIRECT in the last 72 hours. Thyroid Function Tests: No results for input(s): TSH, T4TOTAL, FREET4, T3FREE, THYROIDAB in the last 72 hours. Anemia Panel: No results for input(s): VITAMINB12, FOLATE, FERRITIN, TIBC, IRON, RETICCTPCT in the last 72 hours. Sepsis Labs: No results for input(s): PROCALCITON, LATICACIDVEN in the last 168 hours.  Recent Results (from the past 240 hour(s))  SARS Coronavirus 2 [facility] order, Performed in [facility] hospital lab) Nasopharyngeal Nasopharyngeal Swab     Status: None   Collection Time: 01/21/19  7:39 PM   Specimen: Nasopharyngeal Swab  Result Value Ref Range Status   SARS Coronavirus 2 NEGATIVE NEGATIVE Final    Comment: (NOTE) If result is NEGATIVE SARS-CoV-2 target nucleic acids are NOT DETECTED. The SARS-CoV-2 RNA is generally detectable in upper and lower  respiratory specimens during the acute phase of infection. The lowest  concentration of SARS-CoV-2 viral copies this assay can detect is 250  copies / mL. A negative result does not preclude  SARS-CoV-2 infection  and should not be used as the sole basis for treatment or other  patient management decisions.  A negative result may occur with  improper specimen collection / handling, submission of specimen other  than nasopharyngeal swab, presence of viral mutation(s) within the  areas targeted by this assay, and inadequate number of viral copies  (<250 copies / mL). A negative result must be combined with clinical  observations, patient history, and epidemiological information. If result is POSITIVE SARS-CoV-2 target nucleic acids are DETECTED. The SARS-CoV-2 RNA is generally detectable in upper and lower  respiratory specimens dur ing the acute phase of infection.  Positive  results are indicative of active infection with SARS-CoV-2.  Clinical  correlation with patient history and other diagnostic information is  necessary to determine patient infection status.  Positive results do  not rule out bacterial infection or co-infection with other viruses. If result is PRESUMPTIVE POSTIVE SARS-CoV-2 nucleic acids MAY BE PRESENT.   A  presumptive positive result was obtained on the submitted specimen  and confirmed on repeat testing.  While 2019 novel coronavirus  (SARS-CoV-2) nucleic acids may be present in the submitted sample  additional confirmatory testing may be necessary for epidemiological  and / or clinical management purposes  to differentiate between  SARS-CoV-2 and other Sarbecovirus currently known to infect humans.  If clinically indicated additional testing with an alternate test  methodology [phone]) is advised. The SARS-CoV-2 RNA is generally  detectable in upper and lower respiratory sp ecimens during the acute  phase of infection. The expected result is Negative. Fact Sheet for Patients:  StrictlyIdeas.no Fact Sheet for Healthcare Providers: BankingDealers.co.za This test is not yet approved or cleared by the  [place] FDA and has been authorized for detection and/or diagnosis of SARS-CoV-2 by FDA under an Emergency Use Authorization (EUA).  This EUA will remain in effect (meaning this test can be used) for the duration of the COVID-19 declaration under Section 564(b)(1) of the Act, 21 U.S.C. section 360bbb-3(b)(1), unless the authorization is terminated or revoked sooner. Performed at [facility], [place] [address] [place], [place] [address]   Gastrointestinal Panel by PCR , Stool     Status: None   Collection Time: 01/22/19 10:08 AM   Specimen: Stool  Result Value Ref Range Status   Campylobacter species NOT DETECTED NOT DETECTED Final   Plesimonas shigelloides NOT DETECTED NOT DETECTED Final   Salmonella species NOT DETECTED NOT DETECTED Final   Yersinia enterocolitica NOT DETECTED NOT DETECTED Final   Vibrio species NOT DETECTED NOT DETECTED Final   Vibrio cholerae NOT DETECTED NOT DETECTED Final   Enteroaggregative E coli (EAEC) NOT DETECTED NOT DETECTED Final   Enteropathogenic E coli (EPEC) NOT DETECTED NOT DETECTED Final   Enterotoxigenic E coli (ETEC) NOT DETECTED NOT DETECTED Final   Shiga like toxin producing E coli (STEC) NOT DETECTED NOT DETECTED Final   Shigella/Enteroinvasive E coli (EIEC) NOT DETECTED NOT DETECTED Final   Cryptosporidium NOT DETECTED NOT DETECTED Final   Cyclospora cayetanensis NOT DETECTED NOT DETECTED Final   Entamoeba histolytica NOT DETECTED NOT DETECTED Final   Giardia lamblia NOT DETECTED NOT DETECTED Final   Adenovirus F40/41 NOT DETECTED NOT DETECTED Final   Astrovirus NOT DETECTED NOT DETECTED Final   Norovirus GI/GII NOT DETECTED NOT DETECTED Final   Rotavirus A NOT DETECTED NOT DETECTED Final   Sapovirus (I, II, IV, and V) NOT DETECTED NOT DETECTED Final    Comment: Performed at [facility], [address] [place], [place] [address]      Radiology Studies: Dg Chest Port 1 View  Result Date: 01/26/2019 CLINICAL DATA:   Cough. EXAM: PORTABLE CHEST 1 VIEW COMPARISON:  Chest radiograph 01/21/2019 FINDINGS: Monitoring leads overlie the patient. Stable cardiac and mediastinal contours. Redemonstrated large right paratracheal mass with leftward deviation of the tracheal air column. Slight interval decrease in size of small layering right pleural effusion with improved aeration of the right mid and lower lung. No pneumothorax. Thoracic spine degenerative changes. IMPRESSION: Redemonstrated large right superior mediastinal mass. Slight interval decrease in size of small right pleural effusion with improved aeration of the right mid and lower lung. Electronically Signed   By: Lovey Newcomer M.D.   On: 01/26/2019 11:59      LOS: 4 days    Alma Friendly, MD [facility]  01/26/2019, 2:38 PM

## 2019-01-26 NOTE — Progress Notes (Signed)
Spoke with wife Schaefer Osburn and notified her of the incident.

## 2019-01-26 NOTE — Progress Notes (Signed)
C[facility] Nurse) called asking questions about if patient is being discharged and she is trying to help patients spouse align the things at home that she needs. I instructed Kevin Potter I would return her call as I was in a patient's room.   I reached out to Claudie Leach to contact [place].

## 2019-01-26 NOTE — Progress Notes (Signed)
Attempted to call wife Jerren Landesman x2  but no reply, left voice mail .

## 2019-01-26 NOTE — Care Management (Signed)
Discussed home CNA support  And hospice services with patient's wife, Ailene Ravel.  She gives permission for me to share only necessary information with the [place] manager, [facility], who is calling the unit.     Tye Maryland states that the purpose of her call is to staff the appropriate CNA based on Mr. Hrabak needs.  Tye Maryland given information about patient's ability to move around, perform ADLs, and level of assistance needed.  Also advised Tye Maryland plans are for d/c tomorrow. Cathy verbalizes understanding.

## 2019-01-27 MED ORDER — ADULT MULTIVITAMIN W/MINERALS CH: 1.0000 | ORAL_TABLET | Freq: Every day | ORAL | Status: AC

## 2019-01-27 NOTE — Discharge Summary (Signed)
Discharge Summary  Kevin Potter [id number] DOB: Dec 21, 1931  PCP: Crist Infante, MD  Admit date: 01/21/2019 Discharge date: 01/27/2019  Time spent: 40 mins  Recommendations for Outpatient Follow-up:  1. Home hospice 2. PCP  Discharge Diagnoses:  Active Hospital Problems   Diagnosis Date Noted   Generalized abdominal pain    Goals of care, counseling/discussion    Palliative care by specialist    Hyperthyroidism [id number]   Chronic systolic CHF (congestive heart failure) ([place]) 01/21/2019   Debility 01/21/2019   Diarrhea 01/21/2019   Atrial flutter ([place])    Chronic anticoagulation    Hypertension     Resolved Hospital Problems  No resolved problems to display.    Discharge Condition: Stable  Diet recommendation: Heart healthy  Vitals:   01/26/19 2122 01/27/19 0437  BP: (!) 106/57 110/62  Pulse: 62 65  Resp: 20 19  Temp: 97.9 F (36.6 C) 97.7 F (36.5 C)  SpO2: 97% 96%    History of present illness:  85 y.o.malewitha complex pastmedical history significant of A.Flutter on chronic coagulation, mild dementia,first-degree AV block, BPH, GERD, HTN, hyperthyroidism, chronic tremor and unstable gait,venous insufficiency of chronic lower extremity edema not tolerant of Lasix. He was admitted for diarrhea from a probably gastroenteritis   Today, patient denies any new complaints, very eager to be discharged.  Patient stable to be discharged home with home hospice  Hospital Course:  Active Problems:   Atrial flutter ([facility])   Chronic anticoagulation   Hypertension   Hyperthyroidism   Chronic systolic CHF (congestive heart failure) ([facility])   Debility   Diarrhea   Generalized abdominal pain   Goals of care, counseling/discussion   Palliative care by specialist   Diarrhea likely 2/2 acute gastroenteritis Resolved Afebrile, with no leukocytosis GI panel is negative   Atrial flutter Rate controlled Continue propanolol, Eliquis for  anticoagulation.  Hypertension Blood pressure fairly stable Continue propranolol  Hyperthyroidism with large kown goiter Follows up with outpatient PCP  Chronic systolic CHF Currently compensated  Debility/deconditioning PT/OT recommending 24-hour care versus SNF Patient/family agreed on home hospice  Right leg swelling Negative for DVT on Doppler.  Pleural effusion Appears to be chronic Monitor closely         Malnutrition Type:  Nutrition Problem: Inadequate oral intake Etiology: altered GI function   Malnutrition Characteristics:  Signs/Symptoms: per patient/family report   Nutrition Interventions:  Interventions: Boost Breeze, MVI   Estimated body mass index is 22.05 kg/m as calculated from the following:   Height as of this encounter: 6\' 1"  (1.854 m).   Weight as of this encounter: 75.8 kg.    Procedures:  None  Consultations:  None  Discharge Exam: BP 110/62 (BP Location: Left Arm)    Pulse 65    Temp 97.7 F (36.5 C) (Oral)    Resp 19    Ht 6\' 1"  (1.854 m)    Wt 75.8 kg    SpO2 96%    BMI 22.05 kg/m   General: NAD Cardiovascular: S1, S2 present Respiratory: CTA B  Discharge Instructions You were cared for by a hospitalist during your hospital stay. If you have any questions about your discharge medications or the care you received while you were in the hospital after you are discharged, you can call the unit and asked to speak with the hospitalist on call if the hospitalist that took care of you is not available. Once you are discharged, your primary care physician will handle any further medical issues. Please note  that NO REFILLS for any discharge medications will be authorized once you are discharged, as it is imperative that you return to your primary care physician (or establish a relationship with a primary care physician if you do not have one) for your aftercare needs so that they can reassess your need for medications and  monitor your lab values.   Allergies as of 01/27/2019   No Known Allergies     Medication List    TAKE these medications   acetaminophen 325 MG tablet Commonly known as: TYLENOL Take 325-650 mg by mouth every 8 (eight) hours as needed for mild pain or headache.   apixaban 5 MG Tabs tablet Commonly known as: Eliquis Take 1 tablet (5 mg total) by mouth 2 (two) times daily.   doxazosin 4 MG tablet Commonly known as: CARDURA Take 4 mg by mouth daily.   fluticasone 50 MCG/ACT nasal spray Commonly known as: FLONASE Place 1-2 sprays into both nostrils daily as needed for allergies or rhinitis.   galantamine 16 MG 24 hr capsule Commonly known as: RAZADYNE ER Take 16 mg by mouth daily with breakfast.   LORazepam 1 MG tablet Commonly known as: ATIVAN Take 1 mg by mouth at bedtime.   memantine 10 MG tablet Commonly known as: NAMENDA Take 10 mg by mouth 2 (two) times daily.   multivitamin with minerals Tabs tablet Take 1 tablet by mouth daily.   omeprazole 40 MG capsule Commonly known as: PRILOSEC Take 1 capsule (40 mg total) by mouth 2 (two) times daily. What changed: when to take this   propranolol 20 MG tablet Commonly known as: INDERAL TAKE ONE TABLET BY MOUTH THREE TIMES A DAY What changed: additional instructions   trihexyphenidyl 2 MG tablet Commonly known as: ARTANE Take 1 tablet (2 mg total) by mouth 2 (two) times daily with a meal.      No Known Allergies Follow-up Information    [facility] Follow up.   Specialty: Home Health Services Why: home health PT  Contact information: [place] [address] [phone]        Crist Infante, MD. Schedule an appointment as soon as possible for a visit in 1 week(s).   Specialty: Internal Medicine Contact information: [place] [address] [phone]            The results of significant diagnostics from this hospitalization (including imaging,  microbiology, ancillary and laboratory) are listed below for reference.    Significant Diagnostic Studies: Ct Head Wo Contrast  Result Date: 01/21/2019 CLINICAL DATA:  Generalized weakness EXAM: CT HEAD WITHOUT CONTRAST TECHNIQUE: Contiguous axial images were obtained from the base of the skull through the vertex without intravenous contrast. COMPARISON:  07/21/2018 MR brain FINDINGS: Brain: No evidence of acute infarction, hemorrhage, extra-axial collection, ventriculomegaly, or mass effect. Generalized cerebral atrophy. Periventricular white matter low attenuation likely secondary to microangiopathy. Vascular: Cerebrovascular atherosclerotic calcifications are noted. Skull: Negative for fracture or focal lesion. Sinuses/Orbits: Visualized portions of the orbits are unremarkable. Visualized portions of the paranasal sinuses and mastoid air cells are unremarkable. Other: None. IMPRESSION: No acute intracranial pathology. Electronically Signed   By: Kathreen Devoid   On: 01/21/2019 17:25   Ct Abdomen Pelvis W Contrast  Result Date: 01/21/2019 CLINICAL DATA:  Diffuse abdominal pain since this morning, history stroke, GERD, hypertension, diverticulosis, atrial flutter EXAM: CT ABDOMEN AND PELVIS WITH CONTRAST TECHNIQUE: Multidetector CT imaging of the abdomen and pelvis was performed using the  standard protocol following bolus administration of intravenous contrast. Sagittal and coronal MPR images reconstructed from axial data set. CONTRAST:  174mL OMNIPAQUE IOHEXOL 300 MG/ML SOLN IV. No oral contrast. COMPARISON:  None FINDINGS: Lower chest: BILATERAL pleural effusions, moderate RIGHT and small LEFT. Bibasilar atelectasis. Calcified granulomata in the lower lobes. Enlargement of cardiac chambers with small pericardial effusion. Hepatobiliary: Multiple dependent calculi within gallbladder. No biliary dilatation. Liver otherwise normal appearance Pancreas: Atrophic, otherwise unremarkable Spleen: Calcified  granulomata, otherwise normal appearance Adrenals/Urinary Tract: Adrenal glands normal appearance. BILATERAL peripelvic renal cysts. Kidneys otherwise normal appearance. Ureters and bladder unremarkable. Stomach/Bowel: Sigmoid and descending colonic diverticulosis without evidence of diverticulitis. Fluid in RIGHT colon. Colon otherwise decompressed. Stomach mildly distended by fluid and air. Multiple dilated small bowel loops throughout the abdomen and pelvis with distal ileum decompressed. Short segment of distal ileum shows questionable mild wall thickening. Findings could represent ileus or low-grade small bowel obstruction. Vascular/Lymphatic: Atherosclerotic calcifications of aorta and iliac arteries. Major vascular structures appear patent. Reproductive: Prostatic enlargement, gland 6.4 x 5.7 x 7.6 cm (volume = 150 cm^3) Other: Free fluid in pelvis and between bowel loops. No definite free air. No hernia. Musculoskeletal: Diffuse osseous demineralization. Degenerative changes of the hip joints. Scattered degenerative disc disease changes lumbar spine. Osseous demineralization. IMPRESSION: Dilated small bowel loops extending to distal ileum where minimal wall thickening is seen, question ileus versus low-grade small-bowel obstruction. Small amounts of scattered free fluid without evidence of free air or perforation. Marked prostatic enlargement. BILATERAL peripelvic renal cysts. Bibasilar pleural effusions and atelectasis RIGHT greater than LEFT. Small pericardial effusion with enlargement of cardiac chambers. Electronically Signed   By: Lavonia Dana M.D.   On: 01/21/2019 17:34   Dg Chest Port 1 View  Result Date: 01/26/2019 CLINICAL DATA:  Cough. EXAM: PORTABLE CHEST 1 VIEW COMPARISON:  Chest radiograph 01/21/2019 FINDINGS: Monitoring leads overlie the patient. Stable cardiac and mediastinal contours. Redemonstrated large right paratracheal mass with leftward deviation of the tracheal air column. Slight  interval decrease in size of small layering right pleural effusion with improved aeration of the right mid and lower lung. No pneumothorax. Thoracic spine degenerative changes. IMPRESSION: Redemonstrated large right superior mediastinal mass. Slight interval decrease in size of small right pleural effusion with improved aeration of the right mid and lower lung. Electronically Signed   By: Lovey Newcomer M.D.   On: 01/26/2019 11:59   Dg Chest Portable 1 View  Result Date: 01/21/2019 CLINICAL DATA:  Short of breath EXAM: PORTABLE CHEST 1 VIEW COMPARISON:  Portable exam 1524 hours compared to 02/05/2017 Correlation: CT chest 08/03/2017 FINDINGS: Enlargement of cardiac silhouette. Large RIGHT paratracheal mass extending from the level of the clavicular heads to the RIGHT hilum, measuring 12.1 x 9.9 cm in size, exerting significant RIGHT to LEFT mass effect upon the trachea with tracheal narrowing. 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 Small RIGHT pleural effusion with bibasilar atelectasis. Upper lungs clear. Calcified granuloma LEFT upper lobe. No pneumothorax or acute osseous finding. IMPRESSION: Again identified large RIGHT superior mediastinal mass corresponding to extension of a RIGHT thyroid mass into the anterior mediastinum. Small RIGHT pleural effusion with bibasilar atelectasis. Enlargement of cardiac silhouette. Electronically Signed   By: Lavonia Dana M.D.   On: 01/21/2019 15:40   Vas [place] Lower Extremity Venous (dvt)  Result Date: 01/22/2019  Lower Venous Study Indications: Edema.  Comparison Study: 09/2015 negative Performing Technologist: June Leap RDMS, RVT  Examination Guidelines: A complete  evaluation includes B-mode imaging, spectral Doppler, color Doppler, and power Doppler as needed of all accessible portions of each vessel. Bilateral testing is considered an integral part of a complete examination. Limited examinations for reoccurring indications may  be performed as noted.  +---------+---------------+---------+-----------+----------+--------------+  RIGHT     Compressibility Phasicity Spontaneity Properties Thrombus Aging  +---------+---------------+---------+-----------+----------+--------------+  CFV       Full            Yes       Yes                                    +---------+---------------+---------+-----------+----------+--------------+  SFJ       Full                                                             +---------+---------------+---------+-----------+----------+--------------+  FV Prox   Full                                                             +---------+---------------+---------+-----------+----------+--------------+  FV Mid    Full                                                             +---------+---------------+---------+-----------+----------+--------------+  FV Distal Full                                                             +---------+---------------+---------+-----------+----------+--------------+  PFV       Full                                                             +---------+---------------+---------+-----------+----------+--------------+  POP       Full            Yes       Yes                                    +---------+---------------+---------+-----------+----------+--------------+  PTV       Full                                                             +---------+---------------+---------+-----------+----------+--------------+  PERO  Full                                                             +---------+---------------+---------+-----------+----------+--------------+     Summary: Right: There is no evidence of deep vein thrombosis in the lower extremity. No cystic structure found in the popliteal fossa.  *See table(s) above for measurements and observations. Electronically signed by Deitra Mayo MD on 01/22/2019 at 3:31:35 PM.    Final     Microbiology: Recent Results (from the  past 240 hour(s))  SARS Coronavirus 2 [facility] order, Performed in [facility] hospital lab) Nasopharyngeal Nasopharyngeal Swab     Status: None   Collection Time: 01/21/19  7:39 PM   Specimen: Nasopharyngeal Swab  Result Value Ref Range Status   SARS Coronavirus 2 NEGATIVE NEGATIVE Final    Comment: (NOTE) If result is NEGATIVE SARS-CoV-2 target nucleic acids are NOT DETECTED. The SARS-CoV-2 RNA is generally detectable in upper and lower  respiratory specimens during the acute phase of infection. The lowest  concentration of SARS-CoV-2 viral copies this assay can detect is 250  copies / mL. A negative result does not preclude SARS-CoV-2 infection  and should not be used as the sole basis for treatment or other  patient management decisions.  A negative result may occur with  improper specimen collection / handling, submission of specimen other  than nasopharyngeal swab, presence of viral mutation(s) within the  areas targeted by this assay, and inadequate number of viral copies  (<250 copies / mL). A negative result must be combined with clinical  observations, patient history, and epidemiological information. If result is POSITIVE SARS-CoV-2 target nucleic acids are DETECTED. The SARS-CoV-2 RNA is generally detectable in upper and lower  respiratory specimens dur ing the acute phase of infection.  Positive  results are indicative of active infection with SARS-CoV-2.  Clinical  correlation with patient history and other diagnostic information is  necessary to determine patient infection status.  Positive results do  not rule out bacterial infection or co-infection with other viruses. If result is PRESUMPTIVE POSTIVE SARS-CoV-2 nucleic acids MAY BE PRESENT.   A presumptive positive result was obtained on the submitted specimen  and confirmed on repeat testing.  While 2019 novel coronavirus  (SARS-CoV-2) nucleic acids may be present in the submitted sample  additional confirmatory  testing may be necessary for epidemiological  and / or clinical management purposes  to differentiate between  SARS-CoV-2 and other Sarbecovirus currently known to infect humans.  If clinically indicated additional testing with an alternate test  methodology [phone]) is advised. The SARS-CoV-2 RNA is generally  detectable in upper and lower respiratory sp ecimens during the acute  phase of infection. The expected result is Negative. Fact Sheet for Patients:  StrictlyIdeas.no Fact Sheet for Healthcare Providers: BankingDealers.co.za This test is not yet approved or cleared by the [place] FDA and has been authorized for detection and/or diagnosis of SARS-CoV-2 by FDA under an Emergency Use Authorization (EUA).  This EUA will remain in effect (meaning this test can be used) for the duration of the COVID-19 declaration under Section 564(b)(1) of the Act, 21 U.S.C. section 360bbb-3(b)(1), unless the authorization is terminated or revoked sooner. Performed at [facility], [place] [address] [place], [place] [address]   Gastrointestinal Panel  by PCR , Stool     Status: None   Collection Time: 01/22/19 10:08 AM   Specimen: Stool  Result Value Ref Range Status   Campylobacter species NOT DETECTED NOT DETECTED Final   Plesimonas shigelloides NOT DETECTED NOT DETECTED Final   Salmonella species NOT DETECTED NOT DETECTED Final   Yersinia enterocolitica NOT DETECTED NOT DETECTED Final   Vibrio species NOT DETECTED NOT DETECTED Final   Vibrio cholerae NOT DETECTED NOT DETECTED Final   Enteroaggregative E coli (EAEC) NOT DETECTED NOT DETECTED Final   Enteropathogenic E coli (EPEC) NOT DETECTED NOT DETECTED Final   Enterotoxigenic E coli (ETEC) NOT DETECTED NOT DETECTED Final   Shiga like toxin producing E coli (STEC) NOT DETECTED NOT DETECTED Final   Shigella/Enteroinvasive E coli (EIEC) NOT DETECTED NOT DETECTED Final   Cryptosporidium  NOT DETECTED NOT DETECTED Final   Cyclospora cayetanensis NOT DETECTED NOT DETECTED Final   Entamoeba histolytica NOT DETECTED NOT DETECTED Final   Giardia lamblia NOT DETECTED NOT DETECTED Final   Adenovirus F40/41 NOT DETECTED NOT DETECTED Final   Astrovirus NOT DETECTED NOT DETECTED Final   Norovirus GI/GII NOT DETECTED NOT DETECTED Final   Rotavirus A NOT DETECTED NOT DETECTED Final   Sapovirus (I, II, IV, and V) NOT DETECTED NOT DETECTED Final    Comment: Performed at [facility], [address] [place], [place] [address]     Labs: Basic Metabolic Panel: Recent Labs  Lab 01/21/19 1507 01/22/19 0141 01/23/19 0843 01/25/19 0149  NA 139 141 138 139  K 4.0 4.3 3.4* 4.0  CL 101 106 105 105  CO2 29 29 25 27   GLUCOSE 107* 115* 158* 95  BUN 12 15 12 8   CREATININE 0.89 0.80 0.79 0.72  CALCIUM 9.5 9.0 8.4* 8.6*  MG 1.9 1.9  --   --   PHOS  --  3.3  --   --    Liver Function Tests: Recent Labs  Lab 01/21/19 1507 01/22/19 0141  AST 14* 14*  ALT 13 11  ALKPHOS 93 82  BILITOT 2.8* 2.5*  PROT 6.7 5.9*  ALBUMIN 3.7 3.1*   Recent Labs  Lab 01/21/19 1507  LIPASE 26   No results for input(s): AMMONIA in the last 168 hours. CBC: Recent Labs  Lab 01/21/19 1507 01/22/19 0141 01/23/19 0843 01/25/19 0149  WBC 6.0 6.7 7.7 5.9  NEUTROABS 4.8  --   --  4.5  HGB 14.2 13.1 12.7* 11.7*  HCT 43.6 40.2 38.1* 35.2*  MCV 95.6 94.6 92.7 93.1  PLT 161 151 161 156   Cardiac Enzymes: No results for input(s): CKTOTAL, CKMB, CKMBINDEX, TROPONINI in the last 168 hours. BNP: BNP (last 3 results) Recent Labs    01/21/19 1507  BNP 325.8*    ProBNP (last 3 results) No results for input(s): PROBNP in the last 8760 hours.  CBG: No results for input(s): GLUCAP in the last 168 hours.     Signed:  Alma Friendly, MD [facility] 01/27/2019, 10:08 AM

## 2019-01-27 NOTE — Progress Notes (Signed)
Pt discharged home in stable condition. Wife picked pt up in private car. No concerns voiced at discharge

## 2019-06-07 DEATH — deceased

## 2020-06-06 DEATH — deceased
